# Patient Record
Sex: Male | Born: 1970 | Race: White | Hispanic: No | Marital: Single | Smoking: Current some day smoker
Health system: Southern US, Community
[De-identification: ages and names within clinical notes are randomized; demographics above are authoritative.]

## PROBLEM LIST (undated history)

## (undated) ENCOUNTER — Emergency Department (HOSPITAL_BASED_OUTPATIENT_CLINIC_OR_DEPARTMENT_OTHER): Admission: EM | Payer: 59 | Source: Home / Self Care

## (undated) DIAGNOSIS — I2510 Atherosclerotic heart disease of native coronary artery without angina pectoris: Secondary | ICD-10-CM

## (undated) DIAGNOSIS — I82409 Acute embolism and thrombosis of unspecified deep veins of unspecified lower extremity: Secondary | ICD-10-CM

## (undated) DIAGNOSIS — J449 Chronic obstructive pulmonary disease, unspecified: Secondary | ICD-10-CM

## (undated) DIAGNOSIS — J45909 Unspecified asthma, uncomplicated: Secondary | ICD-10-CM

## (undated) DIAGNOSIS — I214 Non-ST elevation (NSTEMI) myocardial infarction: Secondary | ICD-10-CM

## (undated) HISTORY — PX: KNEE SURGERY: SHX244

## (undated) HISTORY — PX: CORONARY ANGIOPLASTY: SHX604

## (undated) HISTORY — PX: TENDON REPAIR: SHX5111

## (undated) HISTORY — DX: Non-ST elevation (NSTEMI) myocardial infarction: I21.4

## (undated) HISTORY — DX: Atherosclerotic heart disease of native coronary artery without angina pectoris: I25.10

## (undated) HISTORY — DX: Chronic obstructive pulmonary disease, unspecified: J44.9

## (undated) HISTORY — PX: CARDIAC CATHETERIZATION: SHX172

## (undated) HISTORY — DX: Unspecified asthma, uncomplicated: J45.909

## (undated) HISTORY — DX: Acute embolism and thrombosis of unspecified deep veins of unspecified lower extremity: I82.409

---

## 1999-12-05 ENCOUNTER — Emergency Department (HOSPITAL_COMMUNITY): Admission: EM | Admit: 1999-12-05 | Discharge: 1999-12-05 | Payer: Self-pay | Admitting: Emergency Medicine

## 2001-01-10 ENCOUNTER — Emergency Department (HOSPITAL_COMMUNITY): Admission: EM | Admit: 2001-01-10 | Discharge: 2001-01-10 | Payer: Self-pay | Admitting: Emergency Medicine

## 2001-10-18 ENCOUNTER — Emergency Department (HOSPITAL_COMMUNITY): Admission: EM | Admit: 2001-10-18 | Discharge: 2001-10-18 | Payer: Self-pay | Admitting: Emergency Medicine

## 2001-12-20 ENCOUNTER — Emergency Department (HOSPITAL_COMMUNITY): Admission: EM | Admit: 2001-12-20 | Discharge: 2001-12-20 | Payer: Self-pay | Admitting: Emergency Medicine

## 2002-04-21 ENCOUNTER — Encounter: Payer: Self-pay | Admitting: Orthopaedic Surgery

## 2002-04-21 ENCOUNTER — Encounter: Admission: RE | Admit: 2002-04-21 | Discharge: 2002-04-21 | Payer: Self-pay | Admitting: Orthopaedic Surgery

## 2002-04-23 ENCOUNTER — Emergency Department (HOSPITAL_COMMUNITY): Admission: EM | Admit: 2002-04-23 | Discharge: 2002-04-23 | Payer: Self-pay | Admitting: Emergency Medicine

## 2002-05-04 ENCOUNTER — Emergency Department (HOSPITAL_COMMUNITY): Admission: EM | Admit: 2002-05-04 | Discharge: 2002-05-04 | Payer: Self-pay | Admitting: *Deleted

## 2002-05-30 ENCOUNTER — Inpatient Hospital Stay (HOSPITAL_COMMUNITY): Admission: EM | Admit: 2002-05-30 | Discharge: 2002-06-01 | Payer: Self-pay | Admitting: *Deleted

## 2002-08-05 DIAGNOSIS — I82409 Acute embolism and thrombosis of unspecified deep veins of unspecified lower extremity: Secondary | ICD-10-CM

## 2002-08-05 HISTORY — DX: Acute embolism and thrombosis of unspecified deep veins of unspecified lower extremity: I82.409

## 2002-11-24 ENCOUNTER — Emergency Department (HOSPITAL_COMMUNITY): Admission: EM | Admit: 2002-11-24 | Discharge: 2002-11-24 | Payer: Self-pay | Admitting: Emergency Medicine

## 2003-01-26 ENCOUNTER — Encounter: Payer: Self-pay | Admitting: Emergency Medicine

## 2003-01-26 ENCOUNTER — Emergency Department (HOSPITAL_COMMUNITY): Admission: EM | Admit: 2003-01-26 | Discharge: 2003-01-26 | Payer: Self-pay | Admitting: Emergency Medicine

## 2003-01-27 ENCOUNTER — Encounter: Admission: RE | Admit: 2003-01-27 | Discharge: 2003-01-27 | Payer: Self-pay | Admitting: Internal Medicine

## 2003-01-31 ENCOUNTER — Encounter: Admission: RE | Admit: 2003-01-31 | Discharge: 2003-01-31 | Payer: Self-pay | Admitting: Internal Medicine

## 2003-02-03 ENCOUNTER — Encounter: Admission: RE | Admit: 2003-02-03 | Discharge: 2003-02-03 | Payer: Self-pay | Admitting: Internal Medicine

## 2003-03-17 ENCOUNTER — Encounter: Payer: Self-pay | Admitting: Emergency Medicine

## 2003-03-17 ENCOUNTER — Emergency Department (HOSPITAL_COMMUNITY): Admission: EM | Admit: 2003-03-17 | Discharge: 2003-03-17 | Payer: Self-pay | Admitting: Emergency Medicine

## 2004-08-29 ENCOUNTER — Ambulatory Visit: Payer: Self-pay | Admitting: Internal Medicine

## 2004-09-26 ENCOUNTER — Ambulatory Visit: Payer: Self-pay | Admitting: Internal Medicine

## 2004-10-11 ENCOUNTER — Ambulatory Visit: Payer: Self-pay | Admitting: Internal Medicine

## 2005-01-21 ENCOUNTER — Ambulatory Visit: Payer: Self-pay | Admitting: Internal Medicine

## 2005-01-22 ENCOUNTER — Ambulatory Visit: Payer: Self-pay | Admitting: Internal Medicine

## 2005-05-28 ENCOUNTER — Ambulatory Visit: Payer: Self-pay | Admitting: Internal Medicine

## 2005-06-25 ENCOUNTER — Ambulatory Visit: Payer: Self-pay | Admitting: Internal Medicine

## 2005-07-24 ENCOUNTER — Ambulatory Visit: Payer: Self-pay | Admitting: Internal Medicine

## 2005-07-25 ENCOUNTER — Ambulatory Visit: Payer: Self-pay | Admitting: Internal Medicine

## 2005-11-19 ENCOUNTER — Ambulatory Visit: Payer: Self-pay | Admitting: Internal Medicine

## 2005-11-20 ENCOUNTER — Ambulatory Visit: Payer: Self-pay | Admitting: Internal Medicine

## 2005-12-25 ENCOUNTER — Ambulatory Visit: Payer: Self-pay | Admitting: Internal Medicine

## 2006-02-24 ENCOUNTER — Ambulatory Visit: Payer: Self-pay | Admitting: Internal Medicine

## 2006-03-17 ENCOUNTER — Encounter: Admission: RE | Admit: 2006-03-17 | Discharge: 2006-03-17 | Payer: Self-pay | Admitting: Internal Medicine

## 2006-05-26 ENCOUNTER — Ambulatory Visit: Payer: Self-pay | Admitting: Internal Medicine

## 2006-09-23 ENCOUNTER — Ambulatory Visit: Payer: Self-pay | Admitting: Internal Medicine

## 2006-12-23 ENCOUNTER — Ambulatory Visit: Payer: Self-pay | Admitting: Internal Medicine

## 2007-04-22 ENCOUNTER — Ambulatory Visit: Payer: Self-pay | Admitting: Internal Medicine

## 2007-04-28 ENCOUNTER — Ambulatory Visit: Payer: Self-pay | Admitting: Internal Medicine

## 2007-06-04 ENCOUNTER — Ambulatory Visit: Payer: Self-pay | Admitting: Internal Medicine

## 2007-08-03 ENCOUNTER — Ambulatory Visit: Payer: Self-pay | Admitting: Internal Medicine

## 2007-08-10 ENCOUNTER — Telehealth: Payer: Self-pay | Admitting: Internal Medicine

## 2007-08-10 ENCOUNTER — Ambulatory Visit: Payer: Self-pay | Admitting: Internal Medicine

## 2007-08-10 DIAGNOSIS — J449 Chronic obstructive pulmonary disease, unspecified: Secondary | ICD-10-CM

## 2007-08-10 DIAGNOSIS — J302 Other seasonal allergic rhinitis: Secondary | ICD-10-CM

## 2007-08-10 DIAGNOSIS — J3089 Other allergic rhinitis: Secondary | ICD-10-CM

## 2007-08-10 DIAGNOSIS — I82409 Acute embolism and thrombosis of unspecified deep veins of unspecified lower extremity: Secondary | ICD-10-CM

## 2007-08-10 DIAGNOSIS — F17200 Nicotine dependence, unspecified, uncomplicated: Secondary | ICD-10-CM

## 2007-08-10 DIAGNOSIS — J4489 Other specified chronic obstructive pulmonary disease: Secondary | ICD-10-CM | POA: Insufficient documentation

## 2007-08-10 DIAGNOSIS — J45909 Unspecified asthma, uncomplicated: Secondary | ICD-10-CM | POA: Insufficient documentation

## 2007-08-20 ENCOUNTER — Ambulatory Visit: Payer: Self-pay | Admitting: Internal Medicine

## 2007-10-19 ENCOUNTER — Ambulatory Visit: Payer: Self-pay | Admitting: Internal Medicine

## 2007-11-16 ENCOUNTER — Telehealth (INDEPENDENT_AMBULATORY_CARE_PROVIDER_SITE_OTHER): Payer: Self-pay | Admitting: *Deleted

## 2008-01-27 ENCOUNTER — Telehealth: Payer: Self-pay | Admitting: Internal Medicine

## 2008-01-27 ENCOUNTER — Ambulatory Visit: Payer: Self-pay | Admitting: Internal Medicine

## 2008-02-18 ENCOUNTER — Ambulatory Visit: Payer: Self-pay | Admitting: Internal Medicine

## 2008-03-11 ENCOUNTER — Telehealth (INDEPENDENT_AMBULATORY_CARE_PROVIDER_SITE_OTHER): Payer: Self-pay | Admitting: *Deleted

## 2008-07-28 ENCOUNTER — Ambulatory Visit: Payer: Self-pay | Admitting: Internal Medicine

## 2008-08-19 ENCOUNTER — Ambulatory Visit: Payer: Self-pay | Admitting: Internal Medicine

## 2009-01-12 ENCOUNTER — Telehealth: Payer: Self-pay | Admitting: Internal Medicine

## 2009-01-13 ENCOUNTER — Ambulatory Visit: Payer: Self-pay | Admitting: Internal Medicine

## 2009-01-13 ENCOUNTER — Telehealth (INDEPENDENT_AMBULATORY_CARE_PROVIDER_SITE_OTHER): Payer: Self-pay | Admitting: *Deleted

## 2009-01-17 ENCOUNTER — Ambulatory Visit: Payer: Self-pay | Admitting: Internal Medicine

## 2009-06-07 ENCOUNTER — Telehealth: Payer: Self-pay | Admitting: Internal Medicine

## 2009-08-09 ENCOUNTER — Telehealth: Payer: Self-pay | Admitting: Internal Medicine

## 2010-01-09 ENCOUNTER — Encounter: Payer: Self-pay | Admitting: Internal Medicine

## 2010-01-09 ENCOUNTER — Telehealth (INDEPENDENT_AMBULATORY_CARE_PROVIDER_SITE_OTHER): Payer: Self-pay | Admitting: *Deleted

## 2010-01-10 ENCOUNTER — Encounter: Payer: Self-pay | Admitting: Internal Medicine

## 2010-02-02 ENCOUNTER — Ambulatory Visit: Payer: Self-pay | Admitting: Internal Medicine

## 2010-02-12 ENCOUNTER — Telehealth (INDEPENDENT_AMBULATORY_CARE_PROVIDER_SITE_OTHER): Payer: Self-pay | Admitting: *Deleted

## 2010-02-26 ENCOUNTER — Telehealth: Payer: Self-pay | Admitting: Internal Medicine

## 2010-04-02 ENCOUNTER — Ambulatory Visit: Payer: Self-pay | Admitting: Internal Medicine

## 2010-04-13 ENCOUNTER — Telehealth (INDEPENDENT_AMBULATORY_CARE_PROVIDER_SITE_OTHER): Payer: Self-pay | Admitting: *Deleted

## 2010-04-30 ENCOUNTER — Telehealth (INDEPENDENT_AMBULATORY_CARE_PROVIDER_SITE_OTHER): Payer: Self-pay | Admitting: *Deleted

## 2010-05-03 ENCOUNTER — Telehealth (INDEPENDENT_AMBULATORY_CARE_PROVIDER_SITE_OTHER): Payer: Self-pay | Admitting: *Deleted

## 2010-06-27 ENCOUNTER — Telehealth (INDEPENDENT_AMBULATORY_CARE_PROVIDER_SITE_OTHER): Payer: Self-pay | Admitting: *Deleted

## 2010-07-02 ENCOUNTER — Telehealth (INDEPENDENT_AMBULATORY_CARE_PROVIDER_SITE_OTHER): Payer: Self-pay | Admitting: *Deleted

## 2010-07-31 ENCOUNTER — Ambulatory Visit: Payer: Self-pay | Admitting: Internal Medicine

## 2010-08-07 ENCOUNTER — Telehealth: Payer: Self-pay | Admitting: Internal Medicine

## 2010-09-04 ENCOUNTER — Ambulatory Visit: Admit: 2010-09-04 | Payer: Self-pay | Admitting: Internal Medicine

## 2010-09-04 NOTE — Progress Notes (Signed)
Summary: chest tightness  Phone Note Call from Patient Call back at Home Phone [phone]   Caller: Patient Call For: young Reason for Call: Talk to Nurse Summary of Call: experiencing earlysummer tightness in chest, using inhaler more.  Can you call in pred[facility]one? [facility] Initial call taken by: Eugene Gavia,  January 09, 2010 8:58 AM  Follow-up for Phone Call        [facility].Carron Curie CMA  January 09, 2010 10:44 AM [phone] returning call to nurse .Marland KitchenChantel Bowne  January 09, 2010 12:23 PM  pt c/o increased chest tightness x 2 days. Denies increased SOB or congestion just tightness. he states he is having to use his proair more often then usual. he staets CY usually gives him prednisone and it helps. Please advise.Carron Curie CMA  January 09, 2010 1:29 PM allergies: NKDA  Additional Follow-up for Phone Call Additional follow up Details #1::        Per CDY-ok to give pred taper as follows. no refills.Reynaldo Minium CMA  January 09, 2010 1:41 PM    Pt is aware of rx sent to pharmacy.Reynaldo Minium CMA  January 09, 2010 1:42 PM     Prescriptions: PREDNISONE 10 MG TABS (PREDNISONE) 1 tab four times daily x 2 days, 3 times daily x 2 days, 2 times daily x 2 days, 1 time daily x 2 days  #20 x 0   Entered by:   Reynaldo Minium CMA   Authorized by:   Waymon Budge MD   Signed by:   Reynaldo Minium CMA on 01/09/2010   Method used:   Electronically to        [facility] [address] [phone]* (retail)       [address]       [place], [place]  [address]       Ph: [phone] or [phone]       Fax: [phone]   RxID:   [id number]

## 2010-09-04 NOTE — Progress Notes (Signed)
Summary: Prednisone RX needed  Phone Note Call from Patient   Caller: Patient Call For: Young Summary of Call: Pt wants to know if Dr. Maple Hudson would call in a RX for Prednisone to take 1 once daily as pt is using ProAir HFA inhaler more often. States he has tried and failed Singulair and other maitence inhalers. Initial call taken by: Reynaldo Minium CMA,  June 27, 2010 2:26 PM  Follow-up for Phone Call        I spoke with CDY about this and he has approved for patient to have Prednisone 5mg  #100 take 1-2 once daily with no refills. Pt is aware and would like to have rx [facility]. RX sent and patient is aware.Reynaldo Minium CMA  June 27, 2010 2:27 PM     New/Updated Medications: PREDNISONE 5 MG TABS (PREDNISONE) take 1-2 by mouth once daily Prescriptions: PREDNISONE 5 MG TABS (PREDNISONE) take 1-2 by mouth once daily  #100 x 0   Entered by:   Reynaldo Minium CMA   Authorized by:   Waymon Budge MD   Signed by:   Reynaldo Minium CMA on 06/27/2010   Method used:   Electronically to        [facility] [address] [phone]* (retail)       [address]       [place], [place]  [address]       Ph: [phone] or [phone]       Fax: [phone]   RxID:   [id number]

## 2010-09-04 NOTE — Progress Notes (Signed)
Summary: sample of proair given  Phone Note Call from Patient Call back at Home Phone [phone]   Caller: Patient Call For: young Reason for Call: Talk to Nurse Summary of Call: proair or preventil albuterol inhaler.  Can he get a sample? Initial call taken by: Eugene Gavia,  April 13, 2010 9:02 AM  Follow-up for Phone Call        Spoke with pt and advised 1 sample of proair left up front for pick up. Follow-up by: Vernie Murders,  April 13, 2010 9:07 AM

## 2010-09-04 NOTE — Progress Notes (Signed)
Summary: Prednisone refill request  Phone Note Call from Patient   Caller: Patient Call For: Andee Chivers Reason for Call: Acute Illness Summary of Call: Pt came in for Rx to [facility]; Predisone works best-cough, congestion, used nebulizer 5 times this weekend. Please advise. Initial call taken by: Reynaldo Minium CMA,  February 26, 2010 8:57 AM  Follow-up for Phone Call        Per CDY- give Predisone 10mg  #20 take 4 x 2days, 3 x 2 days, 2 x 2 days, 1 x 2 days then stop no refills.Reynaldo Minium CMA  February 26, 2010 8:59 AM     Prescriptions: PREDNISONE 10 MG TABS (PREDNISONE) 4 tablets x 2days, 3 x 2 days, 2 x 2 days, 1 x 2 days, then stop  #20 x 0   Entered by:   Reynaldo Minium CMA   Authorized by:   Waymon Budge MD   Signed by:   Reynaldo Minium CMA on 02/26/2010   Method used:   Telephoned to ...       [facility] [address] [phone]* (retail)       [address]       [place], [place]  [address]       Ph: [phone] or [phone]       Fax: [phone]   RxID:   [id number]

## 2010-09-04 NOTE — Progress Notes (Signed)
Summary: prescript  Phone Note Call from Patient Call back at [phone]   Caller: Patient Call For: young Summary of Call: need prednisone for breathing problem call pt when done Initial call taken by: Rickard Patience,  August 09, 2009 10:54 AM  Follow-up for Phone Call        pt c/o increased chest tightness as well as productive cough with clear phlegm x 4 days. Pt states he has been using his albuterol neb more then usual as well as his nebs at home. Pt requesting rx for prednisone. Please advise. Allergies: NKDA. Carron Curie CMA  August 09, 2009 11:21 AM   Additional Follow-up for Phone Call Additional follow up Details #1::        Per CY,  prednisone 10mg  #20  4 x2 days, 3 x2days, 2 x2days, 1 x 2days.   will forward back to triage. Gweneth Dimitri RN  August 09, 2009 12:39 PM  rx sent. pt aware.Carron Curie CMA  August 09, 2009 2:47 PM     New/Updated Medications: PREDNISONE 10 MG TABS (PREDNISONE) 4 tablets x 2days, 3 x 2 days, 2 x 2 days, 1 x 2 days, then stop Prescriptions: PREDNISONE 10 MG TABS (PREDNISONE) 4 tablets x 2days, 3 x 2 days, 2 x 2 days, 1 x 2 days, then stop  #20 x 0   Entered by:   Carron Curie CMA   Authorized by:   Waymon Budge MD   Signed by:   Carron Curie CMA on 08/09/2009   Method used:   Electronically to        [facility] [address] [phone]* (retail)       [address]       [place], [place]  [address]       Ph: [phone] or [phone]       Fax: [phone]   RxID:   [id number]

## 2010-09-04 NOTE — Medication Information (Signed)
Summary: Enrollment Form/[employer]  Enrollment Form/[employer]   Imported By: Sherian Rein 01/15/2010 10:49:09  _____________________________________________________________________  External Attachment:    Type:   Image     Comment:   External Document

## 2010-09-04 NOTE — Assessment & Plan Note (Signed)
Summary: yearly follow up visit/kcw   Primary Provider/Referring Provider:  Elmore Guise  CC:  yearly follow up visit-allergies..  History of Present Illness: 02/17/09- 40 year old man returning for follow-up.  History of deep vein thrombosis, and allergic rhinitis with asthma.  He is developing COPD, but continues to smoke despite extensive discussion.  We called in a prednisone taper, April 13.  He feels he is doing well today.  There is always some wheeze.  Pulmonary function testing in January showed mild to moderate obstruction.  He continues allergy vaccine at 1:10 without problems through he denies fever, purulent discharge, adenopathy, blood, chest pain or palpitation, leg pain.  08/19/08- Sore throat, cough, now dry hack, nasal stuffy. No fever but on tylenol. Still smokes 1 ppW. Not needed nebulizer lately. Doing his allergy shots.Scant green. Discussed options, and leaned on him again about smoking cessation.  01/17/09- Asthma/ copd, hx DVT, allergic rhinitis, tobacco Finishing Biaxin and helps, but needing his inhaler much more in last 2 days and feels tight. Feels that a neb, depo and pred taper would fix him. Denies residual fever or sore throat but this same pattern has  been going around at work.   April 02, 2010- Asthma/ COPD. hx DVT, allergic rhinitis, tobacco TB test PPD was Neg- done for work. Has no antiinflammatory meds now. Uses his albuterol by neb only every few months.  uses Proair 2x/ day on average. Feels pretty good now. Last prednisone was 1 month ago, after spray painting outdoors in the heat.     Asthma History    Initial Asthma Severity Rating:    Age range: 12+ years    Symptoms: daily    Nighttime Awakenings: 0-2/month    Interferes w/ normal activity: no limitations    SABA use (not for EIB): daily    Asthma Severity Assessment: Moderate Persistent   Preventive Screening-Counseling & Management  Alcohol-Tobacco     Smoking Status: current     Smoke  Cessation Stage: contemplative     Packs/Day: 1/2 or less     Year Started: 1990     Tobacco Counseling: to quit use of tobacco products  Current Medications (verified): 1)  Proair Hfa 108 (90 Base) Mcg/act  Aers (Albuterol Sulfate) .... Inhale 2 Puffs Every 4 To 6 Hours As Needed For Shortness of Breath 2)  Albuterol Sulfate (2.5 Mg/75ml) 0.083%  Nebu (Albuterol Sulfate) .... Use As Directed 3)  Allergy Vaccine 1:10 Go (W-E) .... Had Been On 1:50 Per Lab. Now Wil Lmove Up To 1:10. 4)  Epipen 2-Pak 0.3 Mg/0.64ml (1:1000)  Devi (Epinephrine Hcl (Anaphylaxis)) .... For Severe Allergic Reaction  Allergies (verified): No Known Drug Allergies  Past History:  Past Medical History: Last updated: 08/10/2007 DVT  left leg with Pulmonary embolism 2004 after knee surgery Knee surgery- ACL Chronic asthma since childhood Allergic rhinitis Tendon repair R wrist  Past Surgical History: Last updated: 08/19/2008 Right wrist tendon repair 6 ACL /cartilage surgeries left knee  Family History: Last updated: 08/10/2007 Mother- asthma  Social History: Last updated: 08/10/2007 Patient is a current smoker. Heat and airconditioning repair   Risk Factors: Smoking Status: current (04/02/2010) Packs/Day: 1/2 or less (04/02/2010)  Review of Systems      See HPI       The patient complains of non-productive cough.  The patient denies shortness of breath with activity, shortness of breath at rest, productive cough, coughing up blood, chest pain, irregular heartbeats, acid heartburn, indigestion, loss of appetite, weight change,  abdominal pain, difficulty swallowing, sore throat, tooth/dental problems, headaches, nasal congestion/difficulty breathing through nose, and sneezing.    Vital Signs:  Patient profile:   40 year old male Height:      75 inches Weight:      237.25 pounds BMI:     29.76 O2 Sat:      97 % on Room air Pulse rate:   85 / minute BP sitting:   118 / 78  (right arm) Cuff size:    regular  Vitals Entered By: Reynaldo Minium CMA (April 02, 2010 9:19 AM)  O2 Flow:  Room air CC: yearly follow up visit-allergies.   Physical Exam  Additional Exam:  General: A/Ox3; pleasant and cooperative, NAD, muscular young man, putting on more weight SKIN: no rash, lesions NODES: no lymphadenopathy HEENT: [place]/AT, EOM- WNL, Conjuctivae- clear, PERRLA, TM-WNL, Nose- clear, Throat- clear and wnl, Mallampati II NECK: Supple w/ fair ROM, JVD- none, normal carotid impulses w/o bruits Thyroid- CHEST: Unlabored bilateral wheeze HEART: RRR, no m/g/r heard ABDOMEN: Soft ZOX:WRUE, nl pulses, no edema  NEURO: Grossly intact to observation      Impression & Recommendations:  Problem # 1:  ASTHMA (ICD-493.90) I am concerned that we can't keep him on a maintenance controller. He  still smokes, works dusty trades, and still tries to deal with this episodically. He says he didn't like Advair aor Symbidcort because they would make him antsy after about a week. He is willing to try Qvar. Does not have good symptom awareness and may need a peak flow meter.  Problem # 2:  TOBACCO ABUSE (ICD-305.1) We talked again about medical concerns with tobacco use, especially aggravation of asthma, available quitting support.  Medications Added to Medication List This Visit: 1)  Qvar 80 Mcg/act Aers (Beclomethasone dipropionate) .... 2 puffs and rinse mouth, twice every day  Other Orders: Est. Patient Level III ([address])  Patient Instructions: 1)  Please schedule a follow-up appointment in 4 months. 2)  Don't for get to get your flu shot Late September-November 3)  Try sample/ script Qvar as a maintenance steroid inhaler 4)  2 puffs and rinxe mouth, twice every day. 5)  Please keep trying to stop smoking Prescriptions: QVAR 80 MCG/ACT AERS (BECLOMETHASONE DIPROPIONATE) 2 puffs and rinse mouth, twice every day  #1 x prn   Entered and Authorized by:   Waymon Budge MD   Signed by:   Waymon Budge MD on 04/02/2010   Method used:   Print then Give to Patient   RxID:   [id number]

## 2010-09-04 NOTE — Progress Notes (Signed)
Summary: rx  Phone Note Call from Patient Call back at Home Phone [phone]   Caller: Patient Call For: young Reason for Call: Talk to Nurse Summary of Call: pt needs a Pro Air & albuterol inhaler.  Going out of town and doesn't want to run out.  FGoing to come by and get a little later.  Initial call taken by: Eugene Gavia,  February 12, 2010 11:34 AM  Follow-up for Phone Call        [facility].  Per pt's records,  it looks like he gets his proair through the [employer] pt assistance program.  Unsure why he would need samples as well.  Arman Filter LPN  February 12, 2010 11:48 AM     Call For: young Summary of Call:Patient walked in office. Leigh checked for samples, out of both.  Patient [facility]  Follow-up by: Lehman Prom,  February 12, 2010 1:03 PM    Prescriptions: PROAIR HFA 108 (90 BASE) MCG/ACT  AERS (ALBUTEROL SULFATE) inhale 2 puffs every 4 to 6 hours as needed for shortness of breath  #1 x 11   Entered by:   Philipp Deputy CMA   Authorized by:   Waymon Budge MD   Signed by:   Philipp Deputy CMA on 02/12/2010   Method used:   Electronically to        [facility] [address] [phone]* (retail)       [address]       [place], [place]  [address]       Ph: [phone] or [phone]       Fax: [phone]   RxID:   [id number]

## 2010-09-04 NOTE — Progress Notes (Signed)
Summary: chest tightness and sinus infection  Phone Note Call from Patient   Caller: Patient Call For: young Summary of Call: pt would like prednisone called to pharmacy for tightness in his chest. [facility] Initial call taken by: Rickard Patience,  May 03, 2010 8:53 AM  Follow-up for Phone Call        called and spoke with pt.  pt believe he is having an asthma/allergy flare up.  Pt c/o tightness in chest, coughing up clear sputum esp at night and in the morning, and increased use of proair and nebs.  Pt states he is having to use proair every 4 to 6 hours "sometimes more" and has use nebs 3 x in the past three days.  Would like rx called in for this.  Also believes he has a sinus infection- head congestion, clear to green nasal drainage, and sinus pressure.  pt denied sore throat or fever.  pt requests abx for this.   please advise.  thanks.  Aundra Millet Reynolds LPN  May 03, 2010 9:59 AM   NKDA  Additional Follow-up for Phone Call Additional follow up Details #1::        Per CDY-okay to give Doxycyline 100mg  # take 2 today then 1 daily until gone no refills and Prednisone 10mg  #20 take 4 by mouth x 2 days, 3 by mouth x 2 days, 2 by mouth x 2 days, 1 by mouth x 2 days, then stop no refills.Reynaldo Minium CMA  May 03, 2010 11:29 AM    Pt aware that RX's sent to drug store.Reynaldo Minium CMA  May 03, 2010 12:04 PM    New Allergies: ! * ZPAK New/Updated Medications: PREDNISONE 10 MG TABS (PREDNISONE) take 4 x 2 days, 3 x 2 days, 2 x 2 days, 1 x 2 days, then stop DOXYCYCLINE HYCLATE 100 MG TABS (DOXYCYCLINE HYCLATE) take 2 by mouth today then 1 daily until gone New Allergies: ! * ZPAKPrescriptions: DOXYCYCLINE HYCLATE 100 MG TABS (DOXYCYCLINE HYCLATE) take 2 by mouth today then 1 daily until gone  #8 x 0   Entered by:   Reynaldo Minium CMA   Authorized by:   Waymon Budge MD   Signed by:   Reynaldo Minium CMA on 05/03/2010   Method used:   Electronically to          [facility] [address] [phone]* (retail)       [address]       [place], [place]  [address]       Ph: [phone] or [phone]       Fax: [phone]   RxID:   [id number] PREDNISONE 10 MG TABS (PREDNISONE) take 4 x 2 days, 3 x 2 days, 2 x 2 days, 1 x 2 days, then stop  #20 x 0   Entered by:   Reynaldo Minium CMA   Authorized by:   Waymon Budge MD   Signed by:   Reynaldo Minium CMA on 05/03/2010   Method used:   Electronically to        [facility] [address] [phone]* (retail)       [address]       [place], [place]  [address]       Ph: [phone] or [phone]       Fax: [phone]   RxID:   [id number]

## 2010-09-04 NOTE — Miscellaneous (Signed)
Summary: TB/[facility]  TB/ HealthCare   Imported By: Sherian Rein 02/12/2010 10:01:11  _____________________________________________________________________  External Attachment:    Type:   Image     Comment:   External Document

## 2010-09-04 NOTE — Assessment & Plan Note (Signed)
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 No seen.  Allergies: No Known Drug Allergies   Other Orders: TB Skin Test [phone]) Admin 1st Vaccine ([address]) No Charge Patient Arrived (NCPA0) (NCPA0)   Immunizations Administered:  PPD Skin Test:    Vaccine Type: PPD    Site: left forearm    Mfr: Sanofi Pasteur    Dose: 0.1 ml    Route: ID    Given by: Reynaldo Minium CMA    Exp. Date: 11/29/2011    Lot #: [id number]  PPD Results    Date of reading: 02/04/2010    Results: < 5mm    Interpretation: negative

## 2010-09-04 NOTE — Medication Information (Signed)
Summary: ProAir Approval Notification form/[employer]  ProAir Approval Notification form/[employer]   Imported By: Sherian Rein 01/15/2010 10:48:21  _____________________________________________________________________  External Attachment:    Type:   Image     Comment:   External Document

## 2010-09-04 NOTE — Progress Notes (Signed)
Summary: speak to nurse  Phone Note Call from Patient Call back at Home Phone [phone]   Caller: Patient Call For: young Summary of Call: Wants to know if Florentina Addison has heard anything from the progam that he is to receive his albuterol inhaler from. Initial call taken by: Darletta Moll,  April 30, 2010 10:51 AM  Follow-up for Phone Call        Katie- I think you or Bjorn Loser must be the contact people on this. Follow-up by: Waymon Budge MD,  April 30, 2010 12:55 PM  Additional Follow-up for Phone Call Additional follow up Details #1::        Will call [employer] and ask about this for the patient.Reynaldo Minium CMA  April 30, 2010 3:30 PM    Spoke with [employer]-placed order for pt's medicine and flagged myself to place new order again in December. Pt is aware that RX should be here by Tuesday next week.Reynaldo Minium CMA  May 01, 2010 9:54 AM

## 2010-09-04 NOTE — Progress Notes (Signed)
Summary: PROAIR HFA PT ASSISTANCE REFILL  ---- Converted from flag ---- ---- 05/01/2010 4:52 PM, Reynaldo Minium CMA wrote: Please call and reorder patients ProAir HFA refill through [employer] assistance program [phone] CardID # [id number]  Takes about a week to get and his next order is due 07-08-10.   Vivianne Spence ------------------------------  I called and placed order for ProAir HFA refill-order to be sent after 07-08-10 to our office. Will call patient once RX is here. FYI: Pt has one more time to refill RX before needing to redo assistance program paperwork.

## 2010-09-06 NOTE — Progress Notes (Signed)
Summary: Acute call-sinus infection  Phone Note Call from Patient Call back at Home Phone [phone]   Caller: Patient Call For: Rustyn Conery Reason for Call: Acute Illness Summary of Call: Pt called stating that he is having a sinus infection, stuffy nose, drainage, and cough. No fever or chills. Requesting Doxycycline or Keflex be called in at [facility] [address] Please Advise. Initial call taken by: Reynaldo Minium CMA,  August 07, 2010 9:07 AM  Follow-up for Phone Call        Per CDY-okay to send Doxycycline 100mg  #8 take 2 today then 1 daily no refills. Also pt must keep appt in January per CDY or no more phone rx's.Reynaldo Minium CMA  August 07, 2010 9:37 AM    I called and left message for patient on phone number given to keep appt and call me back today to discuss this matter with him.Reynaldo Minium CMA  August 07, 2010 9:38 AM     Prescriptions: DOXYCYCLINE HYCLATE 100 MG TABS (DOXYCYCLINE HYCLATE) take 2 by mouth today then 1 daily until gone  #8 x 0   Entered by:   Reynaldo Minium CMA   Authorized by:   Waymon Budge MD   Signed by:   Reynaldo Minium CMA on 08/07/2010   Method used:   Electronically to        [facility]  [facility] [address] [phone]* (retail)       [address]       [place], [place]  [address]       Ph: [phone] or [phone]       Fax: [phone]   RxID:   [id number]

## 2010-09-14 ENCOUNTER — Other Ambulatory Visit: Payer: Self-pay | Admitting: Internal Medicine

## 2010-09-14 ENCOUNTER — Encounter: Payer: Self-pay | Admitting: Internal Medicine

## 2010-09-14 ENCOUNTER — Ambulatory Visit (INDEPENDENT_AMBULATORY_CARE_PROVIDER_SITE_OTHER)
Admission: RE | Admit: 2010-09-14 | Discharge: 2010-09-14 | Disposition: A | Discharge status: Home / Self Care | Payer: PRIVATE HEALTH INSURANCE | Source: Ambulatory Visit | Attending: Internal Medicine | Admitting: Internal Medicine

## 2010-09-14 ENCOUNTER — Ambulatory Visit (INDEPENDENT_AMBULATORY_CARE_PROVIDER_SITE_OTHER): Payer: PRIVATE HEALTH INSURANCE | Admitting: Internal Medicine

## 2010-09-14 DIAGNOSIS — J449 Chronic obstructive pulmonary disease, unspecified: Secondary | ICD-10-CM

## 2010-09-14 DIAGNOSIS — J309 Allergic rhinitis, unspecified: Secondary | ICD-10-CM

## 2010-09-14 DIAGNOSIS — J45909 Unspecified asthma, uncomplicated: Secondary | ICD-10-CM

## 2010-09-14 DIAGNOSIS — F17200 Nicotine dependence, unspecified, uncomplicated: Secondary | ICD-10-CM

## 2010-09-20 NOTE — Assessment & Plan Note (Signed)
Summary: follow up visit/kcw   Primary Provider/Referring Provider:  Elmore Guise  CC:  Follow up visit-allergies and asthma.Using Prednisone occasionally.Tyler [name]  History of Present Illness: 01/17/09- Asthma/ copd, hx DVT, allergic rhinitis, tobacco Finishing Biaxin and helps, but needing his inhaler much more in last 2 days and feels tight. Feels that a neb, depo and pred taper would fix him. Denies residual fever or sore throat but this same pattern has  been going around at work.   April 02, 2010- Asthma/ COPD. hx DVT, allergic rhinitis, tobacco TB test PPD was Neg- done for work. Has no antiinflammatory meds now. Uses his albuterol by neb only every few months.  uses Proair 2x/ day on average. Feels pretty good now. Last prednisone was 1 month ago, after spray painting outdoors in the heat.   September 14, 2010- Asthma/ COPD. hx DVT, allergic rhinitis, tobacco Nurse-CC: Follow up visit-allergies and asthma.Using Prednisone occasionally. Has had long work hours, needing to call occasionally for antibiotics and care for sinusitis and bronchitis. he uses a little prednisone occasionally but tries to be sparing. None in 2 weeks and feels well currently. Expects some cough and wheeze, expecially in the evenings. Uses Proair about twice daily. Has slowed way down on smoking, going to gym. Neb only twice in 6-7 months. Drifted off Qvar when w/o insurance.  Asthma History    Asthma Control Assessment:    Age range: 12+ years    Symptoms: >2 days/week    Nighttime Awakenings: 0-2/month    Interferes w/ normal activity: no limitations    SABA use (not for EIB): several times per day    Asthma Control Assessment: Very Poorly Controlled   Preventive Screening-Counseling & Management  Alcohol-Tobacco     Smoking Status: current     Smoke Cessation Stage: contemplative     Packs/Day: 1/2 or less     Year Started: 1990     Tobacco Counseling: to quit use of tobacco products  Current Medications  (verified): 1)  Proair Hfa 108 (90 Base) Mcg/act  Aers (Albuterol Sulfate) .... Inhale 2 Puffs Every 4 To 6 Hours As Needed For Shortness of Breath 2)  Albuterol Sulfate (2.5 Mg/5ml) 0.083%  Nebu (Albuterol Sulfate) .... Use As Directed 3)  Allergy Vaccine 1:10 Go (W-E) .... Had Been On 1:50 Per Lab. Now Wil Lmove Up To 1:10. 4)  Epipen 2-Pak 0.3 Mg/0.31ml (1:1000)  Devi (Epinephrine Hcl (Anaphylaxis)) .... For Severe Allergic Reaction 5)  Qvar 80 Mcg/act Aers (Beclomethasone Dipropionate) .... 2 Puffs and Rinse Mouth, Twice Every Day 6)  Prednisone 5 Mg Tabs (Prednisone) .... Take 1-2 By Mouth Once Daily  Allergies (verified): 1)  ! * Zpak  Past History:  Past Medical History: Last updated: 08/10/2007 DVT  left leg with Pulmonary embolism 2004 after knee surgery Knee surgery- ACL Chronic asthma since childhood Allergic rhinitis Tendon repair R wrist  Past Surgical History: Last updated: 08/19/2008 Right wrist tendon repair 6 ACL /cartilage surgeries left knee  Family History: Last updated: 09/14/2010 Mother- living, DM Father- died unknown cause  Social History: Last updated: 09/14/2010 Patient is a current smoker. Heat and airconditioning repair  Divorced, 1 daughter  Risk Factors: Smoking Status: current (09/14/2010) Packs/Day: 1/2 or less (09/14/2010)  Family History: Mother- living, DM Father- died unknown cause  Social History: Patient is a current smoker. Heat and airconditioning repair  Divorced, 1 daughter  Review of Systems      See HPI       The patient  complains of non-productive cough and nasal congestion/difficulty breathing through nose.  The patient denies shortness of breath with activity, shortness of breath at rest, productive cough, coughing up blood, chest pain, irregular heartbeats, acid heartburn, indigestion, loss of appetite, weight change, abdominal pain, difficulty swallowing, sore throat, tooth/dental problems, headaches, sneezing,  itching, ear ache, anxiety, hand/feet swelling, rash, change in color of mucus, and fever.    Vital Signs:  Patient profile:   40 year old male Height:      75 inches Weight:      225.25 pounds BMI:     28.26 O2 Sat:      97 % on Room air Pulse rate:   68 / minute BP sitting:   148 / 88  (left arm) Cuff size:   large  Vitals Entered By: Reynaldo Minium CMA (September 14, 2010 3:40 PM)  O2 Flow:  Room air CC: Follow up visit-allergies and asthma.Using Prednisone occasionally.   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 Cough was raspy x 1. HEART: RRR, no m/g/r heard ABDOMEN: Soft ZOX:WRUE, nl pulses, no edema  NEURO: Grossly intact to observation      Impression & Recommendations:  Problem # 1:  ASTHMA (ICD-493.90) Chronic obstructive asthma/ chronic bronchitis. We are pushing at his continued smoking, but pleased he has cut down. I had a detailed discussion about steroid use- systemic vs inhaled, and steroid side effects especialy bone.  He agrees to bone denisty check and we will update CXR. He dropped off allergy vaccine when he lost insurance.  Failed Singulair, never had theophylline.  He has chosen not to get flu or pneumovax- discussed.   Problem # 2:  TOBACCO ABUSE (ICD-305.1) Tobaco counseling reinforced.   Other Orders: Est. Patient Level III (45409) T-Bone Densitometry (81191) T-2 View CXR (71020TC)  Patient Instructions: 1)  Please schedule a follow-up appointment in 6 months. 2)  A Dexa Scan has been recommended.  Your imaging study may require preauthorization.  3)  A chest x-ray has been recommended.   Your imaging study may require preauthorization.  4)  Use the least amount of prednisone you can get by with. Talk to [place] if you need guidance or are not doing well. 5)  Please try very hard to get off those cigarettes. 6)  [facility] smoking cessation info sheet.  7)  Refill script for neb medicine Prescriptions: ALBUTEROL SULFATE (2.5 MG/3ML) 0.083%  NEBU (ALBUTEROL SULFATE) use as directed  #25 x prn   Entered and Authorized by:   Waymon Budge MD   Signed by:   Waymon Budge MD on 09/14/2010   Method used:   Print then Give to Patient   RxID:   [id number]

## 2010-11-07 ENCOUNTER — Telehealth: Payer: Self-pay | Admitting: Internal Medicine

## 2010-11-07 MED ORDER — DOXYCYCLINE HYCLATE 100 MG PO TABS: ORAL_TABLET | ORAL | Status: DC

## 2010-11-07 MED ORDER — PREDNISONE 10 MG PO TABS: ORAL_TABLET | ORAL | Status: DC

## 2010-11-07 NOTE — Telephone Encounter (Signed)
Pt is aware that RX's sent.Tyler [name]

## 2010-11-07 NOTE — Telephone Encounter (Signed)
Spoke with pt and he c/o cough w/ clear phlem, chest congestion, watery eyes, post nasal drip. Pt believes he has a sinus infection. Pt req to have prednisone and doxycycline called in to [facility]. Dr. Maple Hudson please advise. Thanks  KNDA  Carver Fila, CMA

## 2010-11-07 NOTE — Telephone Encounter (Signed)
Ok to script doxycycline 100 mg, # 8, 2 today then one daily    Refill x 2                     Prednisone 10 mg, # 20,   4 X 2 DAYS, 3 X 2 DAYS, 2 X 2 DAYS, 1 X 2 DAYS    Ref x 2

## 2010-12-18 NOTE — Assessment & Plan Note (Signed)
[facility]                             PULMONARY OFFICE NOTE   NAME:Tyler [name], Tyler [name]                MRN:          [id number]  DATE:04/22/2007                            DOB:          [birth year]    PROBLEM:  1. Chronic asthma/obstructive pulmonary disease.  2. Pulmonary embolism after knee surgery 2004.  3. Allergic rhinitis.  4. Tobacco abuse.  5. Rhinitis medicamentosa.   HISTORY:  He dropped off his allergy vaccine about 3 months ago saying  he forgot and describes being sent out of town on work a couple of  times.  His girlfriend works at [employer] allergy office and  apparently is on him to get restarted on his shots.  He does heat and  air conditioning work.  He needed a prednisone burst from [place] in late  August but feels well today. He cancelled his last pulmonary function  tests which he was sent out of town.  He could not afford Symbicort,  dropped off of Asmanex and essentially is taking no medications now.  I  reviewed medication options, goals and management.   OBJECTIVE:  VITAL SIGNS:  Weight 230 pounds.  Blood pressure 138/76.  Pulse 76.  Room air saturation 100%.  CHEST:  Wheezy quality to his laughter, otherwise clear with no wheeze  during quiet breathing.  HEART:  Sounds regular and normal.   IMPRESSION:  1. Medication noncompliance.  2. Tobacco use.  3. Rhinitis.  4. Asthma/chronic obstructive pulmonary disease.   PLAN:  1. Smoking cessation.  2. Steroid discussion.  3. Pulmonary function tests.  4. Chest x-ray.  5. We are going to try once more to restart allergy vaccine, only as a      component of therapy.  6. Refill albuterol rescue inhaler with discussion.  7. Scheduled return four months, earlier p.r.n.    Clinton D. Maple Hudson, MD, Tonny Bollman, FACP  Electronically Signed   CDY/MedQ  DD: 04/22/2007  DT: 04/23/2007  Job #: [id number]   cc:   Erskine Speed, M.D.

## 2010-12-18 NOTE — Assessment & Plan Note (Signed)
[facility]                             PULMONARY OFFICE NOTE   NAME:[name], [name]                MRN:          [id number]  DATE:06/04/2007                            DOB:          [birth year]    PROBLEMS:  1. Chronic asthma/chronic obstructive pulmonary disease.  2. Pulmonary embolism after knee surgery in 2004.  3. Allergic rhinitis.  4. Tobacco abuse.  5. Rhinitis medicamentosa   HISTORY:  Head cold has moved into his chest. Head congestion, sore  throat, cough, scant phlegm. He has been doing saline lavage b.i.d. No  routine medication except for an albuterol inhaler in spite of his  previous problems and multiple medications that we have tried to get  started for him. He does insist that he continues his vaccine now at 1  to 68 with no problems. He got flu vaccine at work.   OBJECTIVE:  Weight 227 pounds, blood pressure 112/60, pulse 85, room air  saturation 97%. Odor of tobacco reflecting ongoing smoking despite  counseling. There is some turbinate edema. Pharynx is red without  exudates. Unlabored wheeze. No hoarseness. Heart sounds are regular  without murmur.   IMPRESSION:  Asthma/chronic obstructive pulmonary disease with acute  exacerbation most consistent with a viral illness; ongoing tobacco  abuse.   PLAN:  Smoking cessation, doxycycline for 7 days, nebulizer Xopenex 1.25  mg, prednisone 8 day taper from 40 mg with steroid talk. Keep scheduled  appointment, earlier p.r.n.     Clinton D. Maple Hudson, MD, Tonny Bollman, FACP  Electronically Signed    CDY/MedQ  DD: 06/05/2007  DT: 06/07/2007  Job #: [id number]   cc:   Erskine Speed, M.D.

## 2010-12-21 NOTE — Assessment & Plan Note (Signed)
[facility]                               PULMONARY OFFICE NOTE   NAME:[name], [name]                   MRN:          [id number]  DATE:02/24/2006                            DOB:          [birth year]    PROBLEM:  1.  Chronic asthma/obstructive airways disease.  2.  Pulmonary embolism after knee surgery 2004.  3.  Allergic rhinitis.  4.  Tobacco use.   HISTORY:  Medications helped after last visit and he says he was doing well  until weather changed this week.  He really has not made a serious effort to  stop smoking.  He says prednisone usually helps and we discussed steroids  with significant side effects and need to use other measures if possible.  He did not make much of his trials with Rhinocort Aqua or Asmanex and has  previously not done well with Singulair, Advair, Foradil or Pulmicort.  Reliability of his compliance with these trials is difficult to assess.  He  does continue allergy vaccine at 1:10, giving his own with Epipen available.  He has talked with me carefully about anaphylaxis concerns about  administration outside of medical office, Epinephrine and risk management  alternatives.  He feels strongly that he needs to continue giving his own  therapy logistically possible.   MEDICATIONS:  Allergy vaccine, Restoril, albuterol inhaler, home nebulizer  with albuterol used rarely.   ALLERGIES:  No medication allergies.   OBJECTIVE:  VITAL SIGNS:  Weight 228 pounds.  BP 116/68.  Pulse regular 78.  Room air saturation 97%.  GENERAL:  No distress at all.  CHEST:  He has mild bilateral expiratory wheeze and slightly congested  sounding cough.  HEART:  Regular without murmur.  Work of breathing is not increased.  EXTREMITIES:  No adenopathy.  No edema, cyanosis or clubbing.  HEENT:  His nasal airway is not obviously obstructed.   IMPRESSION:  1.  Asthma with chronic obstructive pulmonary disease.  I am concerned that      we have  not been able to establish a maintenance program with anti-      inflammatory medicines.  2.  Tobacco abuse.  3.  Rhinitis.   PLAN:  1.  We discussed and prescribed Chantix with smoking cessation after this.  2.  Allergy vaccine risk, policy, Epinephrine anaphylaxis and wavier      discussion.  3.  Sample trials Symbicort 80, 2 puffs b.i.d.  4.  Schedule return in 3 months, earlier p.r.n.                                   Clinton D. Maple Hudson, MD, FCCP, FACP   CDY/MedQ  DD:  02/25/2006  DT:  02/26/2006  Job #:  [id number]

## 2010-12-21 NOTE — Assessment & Plan Note (Signed)
[facility]                             PULMONARY OFFICE NOTE   NAME:Levandoski, ORDELL ERIC                MRN:          [id number]  DATE:09/23/2006                            DOB:          [birth year]    PROBLEM:  1. Chronic asthma/chronic obstructive airways disease.  2. Pulmonary embolism after knee surgery 2004.  3. Allergic rhinitis.  4. Tobacco use.   HISTORY:  He says he is using about 3 cigarettes a day.  Took Avalox  from Dr. Chilton Si for bronchitis earlier this winter.  Still feels somewhat  congested.  Symbicort did not work very well, and he says it was too  expensive.  He is out of his nebulizer solution.  He is trying to use a  dust mask at work.  He dropped off of allergy vaccine.  Using only an  albuterol inhaler, and he would use his nebulizer machine if he had the  medication.   OBJECTIVE:  Weight 238 pounds.  BP 128/88.  Pulse regular at 90.  Room  air saturation 96%.  He has mild nasal congestion.  Wheezy cough.  Normal heart sounds.  No accessory muscle use.  No edema.   IMPRESSION:  Chronic asthma/chronic obstructive pulmonary disease with  some exacerbation.  Financial difficulties partly with medication  compliance.  There is an allergic component.  We will see how he does as  the spring season begins.   PLAN:  1. Smoking cessation was emphasized again.  2. Prednisone 8-day taper from 40 mg with steroid talk.  3. We refilled his albuterol inhaler and his albuterol nebulizer      solution.  4. Add Asmanex 1 puff b.i.d. with steroid talk.  5. Schedule return in 3 months, earlier p.r.n.     Clinton D. Maple Hudson, MD, Tonny Bollman, FACP  Electronically Signed    CDY/MedQ  DD: 09/27/2006  DT: 09/27/2006  Job #: [id number]   cc:   Erskine Speed, M.D.

## 2010-12-21 NOTE — H&P (Signed)
NAME:  Tyler [name], Tyler [name]                      ACCOUNT NO.:  [id number]   MEDICAL RECORD NO.:  [id number]                   PATIENT TYPE:  IPS   LOCATION:  0502                                 FACILITY:  [facility]   PHYSICIAN:  Geoffery Lyons, M.D.                   DATE OF BIRTH:  1971-08-01   DATE OF ADMISSION:  05/30/2002  DATE OF DISCHARGE:                         PSYCHIATRIC ADMISSION ASSESSMENT   DATE OF ASSESSMENT:  May 31, 2002   CHIEF COMPLAINT:  This was just a stupid I did because I had a bad day.   PATIENT IDENTIFICATION:  This 40 year old separated white male who is an  involuntary admission.  He goes by the name of Tyler [name].   HISTORY OF PRESENT ILLNESS:  This patient was involuntarily petitioned by  his mother after he wrote a suicide note to his wife.  His wife then  notified his mother.  When the officers arrived to serve the petition, they  found him with two loaded guns, apparently a shotgun and a handgun.  The  patient today denies any suicidal ideation at this time.  He is irritable  and resistant to interview.  He reports that he was under the influence of  alcohol when he wrote the suicide note to his wife and he regrets doing it,  states he feels that everything just came down on top of him all at once.  He denies any history of depression symptoms but does endorse a weight loss  of approximately 30 pounds in the past six months, which he reports is  intentional.  He does endorse feelings of sadness and stress, but he refuses  to elaborate further.  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  He denies any chronic problems  with alcohol abuse; however, his ex-wife had reported that he had been doing  heavy drinking over the past several weeks.   PAST PSYCHIATRIC HISTORY:  The patient has no prior treatment history for  depression or mental illness, no prior hospitalizations, no history of  suicide  attempts.   SUBSTANCE ABUSE HISTORY:  The patient, although he does have a history of  occasional abuse, he reports that this past episode of drinking was a one-  time only event, that he is not a chronic alcohol abuser.   PAST MEDICAL HISTORY:  The patient's primary care physician is Erskine Speed, M.D.  Medical problems include asthma and the patient does endorse  that he has been on planning on talking to his primary care physician about  his stress level.   MEDICATIONS:  None.   DRUG ALLERGIES:  OXYCODONE.   PHYSICAL EXAMINATION:  GENERAL:  The patient is resistant to the idea of a  physical examination at this time, so we will defer that a bit until he  becomes more cooperative.  VITAL SIGNS:  On admission to the unit, temperature 98, pulse 86,  respirations 18, blood pressure 124/76.  He is 6 feet 3 inches tall and  weighs 198 pounds.   LABORATORY DATA:  Diagnostic studies reveal essentially normal labs.  His  thyroid panel is currently pending.  CBC and metabolic panel are  unremarkable.   SOCIAL HISTORY:  The patient has a high school education.  He has been  married for the past five years.  He has one young daughter who is currently  living with her mother.  The patient himself is currently living alone.  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  He has no legal charges against him.  He has been separated from  his wife for at least a year and reports that they are in the process of  working through their separation.  He declines to offer any information or  input about his insights into the relationship.   FAMILY HISTORY:  Family history is remarkable for a grandfather who  committed suicide by a gunshot wound and a cousin who also committed suicide  by gunshot wound.   MENTAL STATUS EXAM:  This is a healthy appearing, fully alert male who is  fairly tall, of medium build.  He is in no acute distress.  He does have a   dull and restricted affect.  He is aloof and generally resistant to  interview.  Speech is normal.  Mood is depressed and irritable.  He has a  strong denial component regarding his level of depression and its effect  upon his life.  Thought process is logical without deficits.  He is positive  for suicidal ideation at this time as far as I can tell but without any  specific plan; no evidence of homicidal ideation, no psychosis.  Cognitive:  Intact and oriented x 3.  Intelligence is average to above average.  Insight: Poor.  Judgment and impulse control: Questionable.  The patient is  a poor historian, generally because of his resistance in interview.   ADMISSION DIAGNOSES:   AXIS I:  1. Depression, not otherwise specified.  2. Rule out ethyl alcohol abuse and dependence.   AXIS II:  Deferred.   AXIS III:  None.   AXIS IV:  Moderate grief secondary to marital separation.   AXIS V:  Current 22, past year 42.   INITIAL PLAN OF CARE:  Plan is to involuntarily admit the patient to treat  his depression, which will presume based on his suicidal ideation and  threats even while he was under the influence of alcohol, and hopefully to  alleviate any suicidal ideation.  We have talked with him about starting  Lexapro 5 mg p.o. daily and the risks and benefits and doing this and he is  agreeable to taking the medication.  Meanwhile, we have also given him  Ambien 10 mg h.s. p.r.n. for any insomnia and we will ask the case manager  to contact his mother for any of her concerns.  We have also made available  to him Librium 25 mg p.o. should he show any signs of alcohol withdrawal,  but to date, has not required any of this.   ESTIMATED LENGTH OF STAY:  Two to three days.     Margaret A. Scott, N.P.                   Geoffery Lyons, M.D.     MAS/MEDQ  D:  06/01/2002  T:  06/01/2002  Job:  [id number]

## 2010-12-21 NOTE — Assessment & Plan Note (Signed)
[facility]                             PULMONARY OFFICE NOTE   NAME:Tyler [name], Tyler [name]                MRN:          [id number]  DATE:12/29/2006                            DOB:          28-Aug-1970    PROBLEM:  1. Chronic asthma/chronic obstructive pulmonary disease.  2. Pulmonary embolism after knee surgery in 2004.  3. Allergic rhinitis.  4. Tobacco use.  5. Rhinitis medicamentosa.   HISTORY:  Mild nasal congestion, chest congestion.  He has worked  outdoors most of the weekend with a lot of exposure to trees and dust  which has caused some increased nasal congestion that he is treating  with DayQuil.  He reminds me of history of nasal fracture x4.  He has  poison ivy rash on his arms and asks a prednisone taper to deal with  that.  Chest tightness a little in the evening.  He continues allergy  vaccine at 1:10 with no problems.  He has not needed to use his Epipen.  We reviewed these issues again.  He is being very cautious and  appropriate about use of his over-the-counter nasal decongestant spray  now.  I reminded him of my concern with that.  He denies any daily  medications and still has an albuterol rescue inhaler for p.r.n. use.  No medication allergy.   OBJECTIVE:  VITAL SIGNS:  Weight 242 pounds, blood pressure 122/82,  pulse 74, room air saturation 97%, mild hoarseness.  HEENT:  Tonsils are present without inflammation.  Pharynx is clear.  There is no wheeze and no postnasal drip.  He does have rash consistent  with poison ivy on his forearms bilaterally.   IMPRESSION:  1. Rhinitis.  2. Asthma with chronic obstructive pulmonary disease.  3. Poison ivy dermatitis.   PLAN:  We talked about ENT referral as an option because of his nasal  fractures.  He will use a decongestant p.r.n. and will watch to see how  that goes.  Schedule pulmonary function tests for comparison with  December of 2006.  Prednisone 8-day taper from 40 mg  with steroid talk,  for poison ivy.  Schedule return in 4 months, earlier p.r.n.     Clinton D. Maple Hudson, MD, Tonny Bollman, FACP  Electronically Signed    CDY/MedQ  DD: 12/29/2006  DT: 12/29/2006  Job #: [id number]   cc:   Erskine Speed, M.D.

## 2010-12-21 NOTE — Assessment & Plan Note (Signed)
[facility]                               PULMONARY OFFICE NOTE   NAME:Tyler [name], Tyler [name]                MRN:          [id number]  DATE:05/26/2006                            DOB:          11/18/1970    PULMONARY FOLLOWUP   PROBLEM LIST:  1. Chronic asthma/obstructive airways disease.  2. Pulmonary embolism after knee surgery in 2004.  3. Allergic rhinitis.  4. Tobacco use.   HISTORY:  He continues allergy vaccine with no problems at 1:10. We reviewed  his lack of success with Foradil and Symbicort. Expense with Symbicort was a  problem. For the past 2 weeks, he has had increased nose and chest  congestion. He got a Z-Pak from Dr. Chilton Si. Nasal discharge has all been  clear. He says he did not try the Chantix but has cut way down on his  cigarette smoking. He declined a flu shot. Symbicort apparently works but is  too expensive. I found out as he left that he carries an Afrin dispenser  with him so we discussed rhinitis medicamentosa.   OBJECTIVE:  VITAL SIGNS:  Weight 240 pounds, BP 128/82, pulse regular 86,  room air saturation 99%. There is mild red turbinate edema, bilateral and  expiratory wheeze, unlabored without cough.  HEENT:  No neck vein distention or stridor.  HEART:  Sounds are regular and normal.   IMPRESSION:  1. Chronic asthma with a fixed obstructive component. We need to work      harder towards a maintenance antiinflammatory.  2. Rhinitis with both allergic and medications induced components.   PLAN:  1. I discussed the availability of ENT surgery referral to look at      mechanically improving his nasal airway and he was interested in his      option. He understands that it want replace the issues of maintenance      therapy, environmental precaution and appropriate limited use of nasal      decongestants but nasal congestion seems to drive a lot of his      complaints and mechanical fix at his age might be  worthwhile.  2. I heavily again emphasized smoking cessation. I think everything would      be better if he stopped.  3. I have give him samples of Symbicort 160. He will hang on to it for use      during his next      exacerbation and see if that strategy works.  4. Schedule return in 4 months, earlier p.r.n.     Clinton D. Maple Hudson, MD, [facility], FACP    CDY/MedQ  DD: 05/31/2006  DT: 06/02/2006  Job #: [id number]   cc:   Erskine Speed, M.D.

## 2010-12-21 NOTE — Discharge Summary (Signed)
NAME:  Tyler [name], Tyler [name]                      ACCOUNT NO.:  [id number]   MEDICAL RECORD NO.:  [id number]                   PATIENT TYPE:  IPS   LOCATION:  0502                                 FACILITY:  [facility]   PHYSICIAN:  Geoffery Lyons, M.D.                   DATE OF BIRTH:  Dec 18, 1970   DATE OF ADMISSION:  05/30/2002  DATE OF DISCHARGE:  06/01/2002                                 DISCHARGE SUMMARY   CHIEF COMPLAINT AND PRESENTING ILLNESS:  This was first admission  to [facility]  for this 40 year old separated white male who was  involuntarily committed.  He was involuntarily committed by his mother after  he wrote a suicide note to his wife.  His wife then notified his mother.  When the police officers arrived to serve the petition, they found him with  2 loaded guns, a shotgun and a handgun.  He denied any suicidal ideas at  that particular time.  He was initially resistant and irritable, admitted he  was under the influence of alcohol when he wrote the suicide note and  regretted doing it.  Denies history of depression, does endorse weight loss  of 30 pounds in the past 6 months prior to this admission.  He reported it  was intentional.  Feelings of sadness expressed.  His ex-wife reported he  had been doing heavy drinking over the past several weeks before this  admission.   PAST PSYCHIATRIC HISTORY:  No previous treatment.   ALCOHOL AND DRUG HISTORY:  History of occasional abuse of alcohol, although  he minimized the use.  No other substances.   PAST MEDICAL HISTORY:  Asthma.   MEDICATIONS:  None.   PHYSICAL EXAMINATION:  Performed, failed to show any acute findings.   MENTAL STATUS EXAM:  Reveals a healthy-appearing, fully alert male, fairly  tall, medium build, no acute distress.  Dull, restricted affect, aloof,  generally resistant to interview.  Speech was normal, mood is depressed,  irritable.  He has strong denial component regarding his level of  depression  and his use of alcohol.  Thought process is logical, without deficit.  Positive for suicidal ideations at the time of the initial evaluation but  none at this particular time.  No homicidal ideas, no hallucinations.  Cognition well preserved.   ADMISSION DIAGNOSES:   AXIS I:  1. Major depression, single episode.  2. Alcohol abuse rule out dependence.   AXIS II:  No diagnosis.   AXIS III:  No diagnosis.   AXIS IV:  Moderate.   AXIS V:  Global assessment of function upon admission 25, highest global  assessment of function in past year 65.   LABORATORY DATA:  CBC was within normal limits.  Blood chemistries were  within normal limits.  Thyroid profile was within normal limits.   COURSE IN HOSPITAL:  He was admitted and started on intensive individual and  group psychotherapy.  He totally denied that he was trying to kill himself,  did admit to a lot of stress, pressure from work, separated from his wife,  increased demands from the family.  Did admit to drinking 6-8 beers that  night he wrote a suicide note.  Had difficult day and was under the  influence.  He regrets having written the note, denies being suicidal.  He  stated that his job knows now and they are going to help him out.  There was  some communication with the wife who felt that he was okay to be discharged.  He was going to follow up with Glendell Docker.  So on October 28, he was  in full contact with reality, no suicidal ideas, no homicidal ideas, willing  to follow up, so discharge was considered and granted.  He had been started  on Lexapro 10 mg per day that he tolerated quite well.   DISCHARGE DIAGNOSES:   AXIS I:  1. Depressive disorder not otherwise specified.  2. Alcohol abuse.   AXIS II:  No diagnosis.   AXIS III:  No diagnosis.   AXIS IV:  Moderate.   AXIS V:  Global assessment of function upon discharge 60.   DISCHARGE MEDICATIONS:  Lexapro 10 mg per day and albuterol  inhaler.   DISPOSITION:  To follow up with Glendell Docker for individual therapy and  Aleatha Borer for medication management.                                                Geoffery Lyons, M.D.    IL/MEDQ  D:  07/05/2002  T:  07/05/2002  Job:  [id number]

## 2011-03-02 ENCOUNTER — Other Ambulatory Visit: Payer: Self-pay | Admitting: Adult Health

## 2011-03-15 ENCOUNTER — Ambulatory Visit: Payer: PRIVATE HEALTH INSURANCE | Admitting: Internal Medicine

## 2011-04-10 ENCOUNTER — Telehealth: Payer: Self-pay | Admitting: Internal Medicine

## 2011-04-10 MED ORDER — PREDNISONE 10 MG PO TABS: ORAL_TABLET | ORAL | Status: DC

## 2011-04-10 NOTE — Telephone Encounter (Signed)
Per CY-okay to give Prednisone 10 mg #20 take 4 x 2 days, 3 x 2 days, 2 x 2 days, 1 x 2 days, then stop; no refills.    Pt is aware of RX sent.

## 2011-07-23 ENCOUNTER — Encounter: Payer: Self-pay | Admitting: Internal Medicine

## 2011-07-23 ENCOUNTER — Ambulatory Visit (INDEPENDENT_AMBULATORY_CARE_PROVIDER_SITE_OTHER): Payer: Managed Care, Other (non HMO) | Admitting: Internal Medicine

## 2011-07-23 VITALS — BP 116/80 | HR 78 | Ht 75.0 in | Wt 221.0 lb

## 2011-07-23 DIAGNOSIS — J309 Allergic rhinitis, unspecified: Secondary | ICD-10-CM

## 2011-07-23 DIAGNOSIS — F17200 Nicotine dependence, unspecified, uncomplicated: Secondary | ICD-10-CM

## 2011-07-23 MED ORDER — ALBUTEROL SULFATE HFA 108 (90 BASE) MCG/ACT IN AERS: 2.0000 | INHALATION_SPRAY | RESPIRATORY_TRACT | Status: DC | PRN

## 2011-07-23 MED ORDER — AMOXICILLIN-POT CLAVULANATE 875-125 MG PO TABS: 1.0000 | ORAL_TABLET | Freq: Two times a day (BID) | ORAL | Status: AC

## 2011-07-23 MED ORDER — METHYLPREDNISOLONE ACETATE 80 MG/ML IJ SUSP
80.0000 mg | Freq: Once | INTRAMUSCULAR | Status: AC
  Administered 2011-07-23: 80 mg via INTRAMUSCULAR

## 2011-07-23 MED ORDER — PHENYLEPHRINE HCL 1 % NA SOLN
3.0000 [drp] | Freq: Once | NASAL | Status: AC
  Administered 2011-07-23: 3 [drp] via NASAL

## 2011-07-23 NOTE — Patient Instructions (Signed)
Neb neo nasal  Depo 80  Script for [employer] refill  Script for augmentin antibiotic  Try taking a probiotic like Flora-Q or Align, or Activia yogurt, to help with stomach upset from antibiotics.

## 2011-07-25 ENCOUNTER — Encounter: Payer: Self-pay | Admitting: Internal Medicine

## 2011-07-25 NOTE — Assessment & Plan Note (Signed)
This is probably non-allergic rhinitis today, most likely viral but likely to progress to a bacterial sinusitis/ bronchitis. Plan- neosyn neb, depo 80, augmentin w/ probiotic

## 2011-07-25 NOTE — Assessment & Plan Note (Signed)
Counseling repeated- available resources for support, medical reasons for stopping now.

## 2011-07-25 NOTE — Progress Notes (Signed)
07/23/11- 40 yoM smoker followed for asthma/ COPD, allergic rhinitis, tobacco use, complicated by hx DVT LOV- 09/14/10  Continues to smoke against advice. Had flu vaccine. Acute visit- 3-4 days of tight sinus congestion w/o sore throat or fever. Chest is ok so far, w/o wheeze, cough or phlegm. He gets assistance for cost of Proair rescue inhaler and has a nebulizer, but has not stayed on maintenance steroid inhaler.   ROS-see HPI Constitutional:   No-   weight loss, night sweats, fevers, chills, fatigue, lassitude. HEENT:   No-  headaches, difficulty swallowing, tooth/dental problems, sore throat,       No-  sneezing, itching, ear ache,  +nasal congestion, post nasal drip,  CV:  No-   chest pain, orthopnea, PND, swelling in lower extremities, anasarca,                                  dizziness, palpitations Resp: No-   shortness of breath with exertion or at rest.              No-   productive cough,  No non-productive cough,  No- coughing up of blood.              No-   change in color of mucus.  No- wheezing.   Skin: No-   rash or lesions. GI:  No-   heartburn, indigestion, abdominal pain, nausea, vomiting, diarrhea,                 change in bowel habits, loss of appetite GU: . MS:  No-   joint pain or swelling.  No- decreased range of motion.  No- back pain. Neuro-     nothing unusual Psych:   OBJ General- Alert, Oriented, Affect-appropriate, Distress- none acute Skin- rash-none, lesions- none, excoriation- none Lymphadenopathy- none Head- atraumatic            Eyes- Gross vision intact, PERRLA, conjunctivae clear secretions            Ears- Hearing, canals-normal            Nose- turbinate edema, no-Septal dev, mucus, polyps, erosion, perforation             Throat- Mallampati II , mucosa clear , drainage- none, tonsils- atrophic Neck- flexible , trachea midline, no stridor , thyroid nl, carotid no bruit Chest - symmetrical excursion , unlabored           Heart/CV- RRR , no  murmur , no gallop  , no rub, nl s1 s2                           - JVD- none , edema- none, stasis changes- none, varices- none           Lung- clear to P&A, wheeze- none, cough- none , dullness-none, rub- none           Chest wall-  Abd- tender-no, distended-no, bowel sounds-present, HSM- no Br/ Gen/ Rectal- Not done, not indicated Extrem- cyanosis- none, clubbing, none, atrophy- none, strength- nl Neuro- grossly intact to observation

## 2011-11-19 ENCOUNTER — Telehealth: Payer: Self-pay | Admitting: Internal Medicine

## 2011-11-19 MED ORDER — PREDNISONE 10 MG PO TABS: ORAL_TABLET | ORAL | Status: DC

## 2011-11-19 MED ORDER — ALBUTEROL SULFATE HFA 108 (90 BASE) MCG/ACT IN AERS: 2.0000 | INHALATION_SPRAY | RESPIRATORY_TRACT | Status: DC | PRN

## 2011-11-19 NOTE — Telephone Encounter (Signed)
Ok proair inhaler # 1, 2 puffs, every 4 hours if needed, refill x 1 Ok Pred taper 10 mg, # 20   4 X 2 DAYS, 3 X 2 DAYS, 2 X 2 DAYS, 1 X 2 DAYS with 1 refill  Please make appointment ROV

## 2011-11-19 NOTE — Telephone Encounter (Signed)
Pt returned call. [phone]. Hazel Sams

## 2011-11-19 NOTE — Telephone Encounter (Signed)
Rx's sent to pharmacy. LMTCB

## 2011-11-19 NOTE — Telephone Encounter (Signed)
CY please advise. Looks as though patient was seen 07-2011 and told to follow up in 1 year. Thanks.

## 2011-11-20 NOTE — Telephone Encounter (Signed)
Pt is aware and picked up the medications last night. Nothing further is needed at this time.

## 2012-01-16 ENCOUNTER — Telehealth: Payer: Self-pay | Admitting: Internal Medicine

## 2012-01-16 MED ORDER — ALBUTEROL SULFATE HFA 108 (90 BASE) MCG/ACT IN AERS: 2.0000 | INHALATION_SPRAY | RESPIRATORY_TRACT | Status: DC | PRN

## 2012-01-16 NOTE — Telephone Encounter (Signed)
Rx sent and patient aware 

## 2012-02-21 ENCOUNTER — Telehealth: Payer: Self-pay | Admitting: Internal Medicine

## 2012-02-21 MED ORDER — ALBUTEROL SULFATE HFA 108 (90 BASE) MCG/ACT IN AERS: 2.0000 | INHALATION_SPRAY | RESPIRATORY_TRACT | Status: DC | PRN

## 2012-02-21 NOTE — Telephone Encounter (Signed)
Spoke with patient-aware that RX and refills sent to pharmacy and will call to make follow up with CY in December since it will be his yearly.

## 2012-02-24 NOTE — Telephone Encounter (Signed)
[facility] @ [phone], spoke with [facility].  Requested a refill on pt's Proair HFA be sent to the office.  Per Shawna Orleans, should arrive in 5-7 business days.  Per Florentina Addison, pt is aware.

## 2012-05-19 ENCOUNTER — Telehealth: Payer: Self-pay | Admitting: Internal Medicine

## 2012-05-19 MED ORDER — PREDNISONE 10 MG PO TABS: ORAL_TABLET | ORAL | Status: DC

## 2012-05-19 NOTE — Telephone Encounter (Signed)
Last OV 07/2011, next ov 07/22/12. I spoke with the pt and he states Saturday he was working outside and thinks he picked up some poison Ivy. Pt c/o red bumps on both arms and hands that are raised and itch. Pt states itching and rash is much worse since Saturday. PT is requesting some prednisone. Pt states his PCP is out of the office today so that is why he is calling here. Please advise. Carron Curie, CMA No Known Allergies

## 2012-05-19 NOTE — Telephone Encounter (Signed)
Per CY-okay to give patient Prednisone 10 mg #20 take 4 x 2 days, 3 x 2 days, 2 x 2 days, 1 x 2 days, then stop no refills. Pt aware that Rx has been sent to pharmacy.

## 2012-08-28 ENCOUNTER — Telehealth: Payer: Self-pay | Admitting: Internal Medicine

## 2012-08-28 MED ORDER — ALBUTEROL SULFATE HFA 108 (90 BASE) MCG/ACT IN AERS: 2.0000 | INHALATION_SPRAY | RESPIRATORY_TRACT | Status: DC | PRN

## 2012-08-28 NOTE — Telephone Encounter (Signed)
Spoke with patient, patient requesting refill on proair inhaler.  Rx sent to [facility].  Also, patient has not been seen in over a yr, so appt scheduled for Tues 09/22/12 @ 345pm.  Verified appt with pt and nothing further needed at this time.

## 2012-09-01 ENCOUNTER — Telehealth: Payer: Self-pay | Admitting: Internal Medicine

## 2012-09-01 NOTE — Telephone Encounter (Signed)
Called [facility], spoke with [place] who stated that this message can be disregarded as she verified that the rx was indeed received on 1.24.14.

## 2012-09-22 ENCOUNTER — Ambulatory Visit (INDEPENDENT_AMBULATORY_CARE_PROVIDER_SITE_OTHER): Payer: Managed Care, Other (non HMO) | Admitting: Internal Medicine

## 2012-09-22 ENCOUNTER — Encounter: Payer: Self-pay | Admitting: Internal Medicine

## 2012-09-22 VITALS — BP 128/76 | HR 99 | Ht 75.0 in | Wt 231.8 lb

## 2012-09-22 DIAGNOSIS — J449 Chronic obstructive pulmonary disease, unspecified: Secondary | ICD-10-CM

## 2012-09-22 DIAGNOSIS — F17200 Nicotine dependence, unspecified, uncomplicated: Secondary | ICD-10-CM

## 2012-09-22 MED ORDER — ALBUTEROL SULFATE (2.5 MG/3ML) 0.083% IN NEBU: 2.5000 mg | INHALATION_SOLUTION | Freq: Four times a day (QID) | RESPIRATORY_TRACT | Status: DC | PRN

## 2012-09-22 MED ORDER — ALBUTEROL SULFATE HFA 108 (90 BASE) MCG/ACT IN AERS: 2.0000 | INHALATION_SPRAY | RESPIRATORY_TRACT | Status: DC | PRN

## 2012-09-22 NOTE — Assessment & Plan Note (Signed)
Continued efforts at counseling and explanation.

## 2012-09-22 NOTE — Progress Notes (Signed)
07/23/11- 40 yoM smoker followed for asthma/ COPD, allergic rhinitis, tobacco use, complicated by hx DVT LOV- 09/14/10  Continues to smoke against advice. Had flu vaccine. Acute visit- 3-4 days of tight sinus congestion w/o sore throat or fever. Chest is ok so far, w/o wheeze, cough or phlegm. He gets assistance for cost of Proair rescue inhaler and has a nebulizer, but has not stayed on maintenance steroid inhaler.    09/22/12-  40 yoM smoker followed for asthma/ COPD, allergic rhinitis, tobacco use, complicated by hx DVT FOLLOWS FOR: denies any SOB, wheezing. head stays stopped up. Has cut down to only one or 2 cigarettes a day. We except that his progress and encouraged him to keep working to get off completely. He says he feels well but admits usually has some dry cough triggered by weather changes. Needs his routine meds refilled  ROS-see HPI Constitutional:   No-   weight loss, night sweats, fevers, chills, fatigue, lassitude. HEENT:   No-  headaches, difficulty swallowing, tooth/dental problems, sore throat,       No-  sneezing, itching, ear ache,  +nasal congestion, post nasal drip,  CV:  No-   chest pain, orthopnea, PND, swelling in lower extremities, anasarca,                                  dizziness, palpitations Resp: No-   shortness of breath with exertion or at rest.              No-   productive cough,  + non-productive cough,  No- coughing up of blood.              No-   change in color of mucus.  No- wheezing.   Skin: No-   rash or lesions. GI:  No-   heartburn, indigestion, abdominal pain, nausea, vomiting,  GU: . MS:  No-   joint pain or swelling.  Neuro-     nothing unusual Psych:   OBJ General- Alert, Oriented, Affect-appropriate, Distress- none acute Skin- rash-none, lesions- none, excoriation- none Lymphadenopathy- none Head- atraumatic            Eyes- Gross vision intact, PERRLA, conjunctivae clear secretions            Ears- Hearing, canals-normal  Nose- turbinate edema, no-Septal dev, mucus, polyps, erosion, perforation             Throat- Mallampati II , mucosa clear , drainage- none, tonsils- atrophic Neck- flexible , trachea midline, no stridor , thyroid nl, carotid no bruit Chest - symmetrical excursion , unlabored           Heart/CV- RRR , no murmur , no gallop  , no rub, nl s1 s2                           - JVD- none , edema- none, stasis changes- none, varices- none           Lung- +coarse, unlabored wheeze, cough- none , dullness-none, rub- none           Chest wall-  Abd-  Br/ Gen/ Rectal- Not done, not indicated Extrem- cyanosis- none, clubbing, none, atrophy- none, strength- nl Neuro- grossly intact to observation

## 2012-09-22 NOTE — Patient Instructions (Addendum)
Scripts sent for [employer] and for nebulizer medication to Drug store  Please call as needed

## 2012-09-22 NOTE — Assessment & Plan Note (Signed)
He also has a chronic bronchitis pattern now. I asked for chest x-ray and he declined, saying he had an appointment. Medications refilled with discussion.

## 2013-08-16 ENCOUNTER — Telehealth: Payer: Self-pay | Admitting: Internal Medicine

## 2013-08-16 MED ORDER — PREDNISONE 10 MG PO TABS: ORAL_TABLET | ORAL | Status: DC

## 2013-08-16 MED ORDER — ALBUTEROL SULFATE (2.5 MG/3ML) 0.083% IN NEBU: 2.5000 mg | INHALATION_SOLUTION | Freq: Four times a day (QID) | RESPIRATORY_TRACT | Status: DC | PRN

## 2013-08-16 MED ORDER — ALBUTEROL SULFATE HFA 108 (90 BASE) MCG/ACT IN AERS: 2.0000 | INHALATION_SPRAY | RESPIRATORY_TRACT | Status: DC | PRN

## 2013-08-16 MED ORDER — AMOXICILLIN-POT CLAVULANATE 875-125 MG PO TABS: 1.0000 | ORAL_TABLET | Freq: Two times a day (BID) | ORAL | Status: DC

## 2013-08-16 NOTE — Telephone Encounter (Signed)
Spoke with patient; aware of Rx's per CY and requested we refill his Proair HFA and albuterol nebulizer. Pt made appointment to see CY 09-23-13 at 9:00am.

## 2013-08-16 NOTE — Telephone Encounter (Signed)
Offer prednisone 10 mg, # 20, 4 X 2 DAYS, 3 X 2 DAYS, 2 X 2 DAYS, 1 X 2 DAYS           augmentin 875, # 14, 1 twice daily, ref x 1

## 2013-08-16 NOTE — Telephone Encounter (Signed)
Called and spoke with pt and he stated that he has the sinus congestion, chest congestion with clear sputum, no fever.  He stated that he is using his inhaler way too often.  He stated that he would like to have pred taper and abx called to the pharmacy.  He requests that a zpak NOT be called in for him since this does not work for him.  Pt stated that he will schedule an appt with CY for OV.   CY please advise. Thanks  Last ov--09/22/2012 No pending appts  No Known Allergies   Current Outpatient Prescriptions on File Prior to Visit  Medication Sig Dispense Refill  . albuterol (PROAIR HFA) 108 (90 BASE) MCG/ACT inhaler Inhale 2 puffs into the lungs every 4 (four) hours as needed for wheezing or shortness of breath.  1 Inhaler  prn  . albuterol (PROVENTIL) (2.5 MG/3ML) 0.083% nebulizer solution Take 3 mLs (2.5 mg total) by nebulization every 6 (six) hours as needed for wheezing or shortness of breath. Use as directed  75 mL  prn  . EPINEPHrine (EPIPEN 2-PAK) 0.3 mg/0.3 mL DEVI Inject 0.3 mg into the muscle once.         No current facility-administered medications on file prior to visit.

## 2013-09-23 ENCOUNTER — Ambulatory Visit (INDEPENDENT_AMBULATORY_CARE_PROVIDER_SITE_OTHER): Payer: Managed Care, Other (non HMO) | Admitting: Internal Medicine

## 2013-09-23 ENCOUNTER — Encounter: Payer: Self-pay | Admitting: Internal Medicine

## 2013-09-23 VITALS — BP 120/76 | HR 67 | Ht 75.0 in | Wt 237.4 lb

## 2013-09-23 DIAGNOSIS — J309 Allergic rhinitis, unspecified: Secondary | ICD-10-CM

## 2013-09-23 DIAGNOSIS — F17200 Nicotine dependence, unspecified, uncomplicated: Secondary | ICD-10-CM

## 2013-09-23 DIAGNOSIS — J45909 Unspecified asthma, uncomplicated: Secondary | ICD-10-CM

## 2013-09-23 DIAGNOSIS — J4489 Other specified chronic obstructive pulmonary disease: Secondary | ICD-10-CM

## 2013-09-23 DIAGNOSIS — J449 Chronic obstructive pulmonary disease, unspecified: Secondary | ICD-10-CM

## 2013-09-23 MED ORDER — PREDNISONE 10 MG PO TABS: ORAL_TABLET | ORAL | Status: DC

## 2013-09-23 MED ORDER — PHENYLEPHRINE HCL 1 % NA SOLN
3.0000 [drp] | Freq: Once | NASAL | Status: AC
  Administered 2013-09-23: 3 [drp] via NASAL

## 2013-09-23 MED ORDER — METHYLPREDNISOLONE ACETATE 80 MG/ML IJ SUSP
80.0000 mg | Freq: Once | INTRAMUSCULAR | Status: AC
  Administered 2013-09-23: 80 mg via INTRAMUSCULAR

## 2013-09-23 NOTE — Progress Notes (Signed)
07/23/11- 40 yoM smoker followed for asthma/ COPD, allergic rhinitis, tobacco use, complicated by hx DVT LOV- 09/14/10  Continues to smoke against advice. Had flu vaccine. Acute visit- 3-4 days of tight sinus congestion w/o sore throat or fever. Chest is ok so far, w/o wheeze, cough or phlegm. He gets assistance for cost of Proair rescue inhaler and has a nebulizer, but has not stayed on maintenance steroid inhaler.    09/22/12-  40 yoM smoker followed for asthma/ COPD, allergic rhinitis, tobacco use, complicated by hx DVT FOLLOWS FOR: denies any SOB, wheezing. head stays stopped up. Has cut down to only one or 2 cigarettes a day. We except that his progress and encouraged him to keep working to get off completely. He says he feels well but admits usually has some dry cough triggered by weather changes. Needs his routine meds refilled  09/23/13- 40 yoM smoker followed for asthma/ COPD, allergic rhinitis, tobacco use, complicated by hx DVT FOLLOWS FOR: needs another round(longer dose) of prednisone; still has refill on augmentin at pharmacy. Using nebulizer again. Continues to have flare ups of chest congetion, sinus trouble and wheezing. Flare of asthma/bronchitis as a viral syndrome after Christmas. We had called in prednisone and Augmentin. He felt well for 2 weeks. Now in the past week he is again acutely ill after being exposed to cold air and to a cat at his mother's house. At uses nebulizer machine twice this week. Admits to smoking 2 cigarettes per day-discussed.  ROS-see HPI Constitutional:   No-   weight loss, night sweats, fevers, chills, fatigue, lassitude. HEENT:   No-  headaches, difficulty swallowing, tooth/dental problems, sore throat,       No-  sneezing, itching, ear ache,  +nasal congestion, post nasal drip,  CV:  No-   chest pain, orthopnea, PND, swelling in lower extremities, anasarca,                                  dizziness, palpitations Resp: No-   shortness of breath with  exertion or at rest.              + productive cough,  + non-productive cough,  No- coughing up of blood.              No-   change in color of mucus.  + wheezing.   Skin: No-   rash or lesions. GI:  No-   heartburn, indigestion, abdominal pain, nausea, vomiting,  GU: . MS:  No-   joint pain or swelling.  Neuro-     nothing unusual  OBJ General- Alert, Oriented, Affect-appropriate, Distress- none acute Skin- rash-none, lesions- none, excoriation- none Lymphadenopathy- none Head- atraumatic            Eyes- Gross vision intact, PERRLA, conjunctivae clear secretions            Ears- Hearing, canals-normal            Nose- turbinate edema, no-Septal dev, mucus, polyps, erosion, perforation             Throat- Mallampati II , mucosa clear , drainage- none, tonsils- atrophic Neck- flexible , trachea midline, no stridor , thyroid nl, carotid no bruit Chest - symmetrical excursion , unlabored           Heart/CV- RRR , no murmur , no gallop  , no rub, nl s1 s2                           -  JVD- none , edema- none, stasis changes- none, varices- none           Lung- +coarse, unlabored wheeze, cough- none , dullness-none, rub- none           Chest wall-  Abd-  Br/ Gen/ Rectal- Not done, not indicated Extrem- cyanosis- none, clubbing, none, atrophy- none, strength- nl Neuro- grossly intact to observation

## 2013-09-23 NOTE — Patient Instructions (Signed)
Script prednisone taper sent  Sample Breo Ellipta   1 puff, then rinse mouth, once daily  Neb neo nasal  Depo 80

## 2013-10-17 NOTE — Assessment & Plan Note (Signed)
He reports smoking 2 cigarettes per day. This may be more habit and nicotine addiction. Offered support and emphasized importance of stopping completely.

## 2013-10-17 NOTE — Assessment & Plan Note (Signed)
Acute exacerbation of asthma/bronchitis Plan-nebulizer Xopenex, slow prednisone taper, Depo-Medrol, sample Breo Ellipta with discussion

## 2013-10-21 ENCOUNTER — Telehealth: Payer: Self-pay | Admitting: Internal Medicine

## 2013-10-21 MED ORDER — ALBUTEROL SULFATE HFA 108 (90 BASE) MCG/ACT IN AERS: 2.0000 | INHALATION_SPRAY | RESPIRATORY_TRACT | Status: DC | PRN

## 2013-10-21 NOTE — Telephone Encounter (Signed)
Pt requested refills on albuterol inhaler.  Per CY this is fine. Advised pt sent in request with additional refills. Nothing further needed

## 2013-10-21 NOTE — Telephone Encounter (Signed)
Rx has been sent in. Pt is aware. Nothing further is needed. 

## 2013-10-25 ENCOUNTER — Other Ambulatory Visit: Payer: Self-pay | Admitting: Internal Medicine

## 2013-12-01 ENCOUNTER — Telehealth: Payer: Self-pay | Admitting: Internal Medicine

## 2013-12-01 MED ORDER — ALBUTEROL SULFATE HFA 108 (90 BASE) MCG/ACT IN AERS: INHALATION_SPRAY | RESPIRATORY_TRACT | Status: DC

## 2013-12-01 NOTE — Telephone Encounter (Signed)
Rx sent to pharmacy to place on hold until patient calls for refills. Nothing more needed.

## 2014-01-21 ENCOUNTER — Ambulatory Visit: Payer: Managed Care, Other (non HMO) | Admitting: Internal Medicine

## 2014-03-04 ENCOUNTER — Telehealth: Payer: Self-pay | Admitting: Internal Medicine

## 2014-03-04 MED ORDER — ALBUTEROL SULFATE 108 (90 BASE) MCG/ACT IN AEPB: 2.0000 | INHALATION_SPRAY | RESPIRATORY_TRACT | Status: DC | PRN

## 2014-03-04 NOTE — Telephone Encounter (Signed)
Pt is aware that sample of Proair Respiclick is at front for pick up and to keep his appt with CY on 03-14-14. Nothing further needed at this time.

## 2014-03-14 ENCOUNTER — Encounter: Payer: Self-pay | Admitting: Internal Medicine

## 2014-03-14 ENCOUNTER — Ambulatory Visit (INDEPENDENT_AMBULATORY_CARE_PROVIDER_SITE_OTHER): Payer: Managed Care, Other (non HMO) | Admitting: Internal Medicine

## 2014-03-14 ENCOUNTER — Ambulatory Visit (INDEPENDENT_AMBULATORY_CARE_PROVIDER_SITE_OTHER)
Admission: RE | Admit: 2014-03-14 | Discharge: 2014-03-14 | Disposition: A | Discharge status: Home / Self Care | Payer: Managed Care, Other (non HMO) | Source: Ambulatory Visit | Attending: Internal Medicine | Admitting: Internal Medicine

## 2014-03-14 VITALS — BP 118/68 | HR 82 | Ht 75.0 in | Wt 246.0 lb

## 2014-03-14 DIAGNOSIS — J45909 Unspecified asthma, uncomplicated: Secondary | ICD-10-CM

## 2014-03-14 DIAGNOSIS — J449 Chronic obstructive pulmonary disease, unspecified: Secondary | ICD-10-CM

## 2014-03-14 MED ORDER — ALBUTEROL SULFATE 108 (90 BASE) MCG/ACT IN AEPB: 2.0000 | INHALATION_SPRAY | RESPIRATORY_TRACT | Status: DC | PRN

## 2014-03-14 NOTE — Patient Instructions (Addendum)
Order- Office spirometry      Dx asthma with bronchitis  Order- CXR         Dx asthma with bronchitis  Script for Proair rescue inhaler  Sample Anoro inhaler    1 puff once daily- maintenance inhaler

## 2014-03-14 NOTE — Progress Notes (Signed)
07/23/11- 40 yoM smoker followed for asthma/ COPD, allergic rhinitis, tobacco use, complicated by hx DVT LOV- 09/14/10  Continues to smoke against advice. Had flu vaccine. Acute visit- 3-4 days of tight sinus congestion w/o sore throat or fever. Chest is ok so far, w/o wheeze, cough or phlegm. He gets assistance for cost of Proair rescue inhaler and has a nebulizer, but has not stayed on maintenance steroid inhaler.    09/22/12-  40 yoM smoker followed for asthma/ COPD, allergic rhinitis, tobacco use, complicated by hx DVT FOLLOWS FOR: denies any SOB, wheezing. head stays stopped up. Has cut down to only one or 2 cigarettes a day. We except that his progress and encouraged him to keep working to get off completely. He says he feels well but admits usually has some dry cough triggered by weather changes. Needs his routine meds refilled  09/23/13- 40 yoM smoker followed for asthma/ COPD, allergic rhinitis, tobacco use, complicated by hx DVT FOLLOWS FOR: needs another round(longer dose) of prednisone; still has refill on augmentin at pharmacy. Using nebulizer again. Continues to have flare ups of chest congetion, sinus trouble and wheezing. Flare of asthma/bronchitis as a viral syndrome after Christmas. We had called in prednisone and Augmentin. He felt well for 2 weeks. Now in the past week he is again acutely ill after being exposed to cold air and to a cat at his mother's house. At uses nebulizer machine twice this week. Admits to smoking 2 cigarettes per day-discussed.  03/14/14- 43 yoM smoker followed for asthma/ COPD, allergic rhinitis, tobacco use, complicated by hx DVT FOLLOWS FOR: c/o "usual problems": sob with exertion, chest congestion, prod cough with mostly clear mucus, has been worse X few weeks.  Says he smoking about 2 cigarettes a day and we discussed ways to get off of the residual cigarettes. Baseline cough. Rarely needs his nebulizer machine. His work exposes him to outdoor weather  quite a lot. Office spirometry 03/14/14- moderate to severe obstructive airways disease  ROS-see HPI Constitutional:   No-   weight loss, night sweats, fevers, chills, fatigue, lassitude. HEENT:   No-  headaches, difficulty swallowing, tooth/dental problems, sore throat,       No-  sneezing, itching, ear ache,  +nasal congestion, post nasal drip,  CV:  No-   chest pain, orthopnea, PND, swelling in lower extremities, anasarca,                                  dizziness, palpitations Resp: No-   shortness of breath with exertion or at rest.              + productive cough,  + non-productive cough,  No- coughing up of blood.              No-   change in color of mucus.  + wheezing.   Skin: No-   rash or lesions. GI:  No-   heartburn, indigestion, abdominal pain, nausea, vomiting,  GU: . MS:  No-   joint pain or swelling.  Neuro-     nothing unusual  OBJ General- Alert, Oriented, Affect-appropriate, Distress- none acute Skin- rash-none, lesions- none, excoriation- none Lymphadenopathy- none Head- atraumatic            Eyes- Gross vision intact, PERRLA, conjunctivae clear secretions            Ears- Hearing, canals-normal  Nose- turbinate edema, no-Septal dev, mucus, polyps, erosion, perforation             Throat- Mallampati II , mucosa clear , drainage- none, tonsils- atrophic Neck- flexible , trachea midline, no stridor , thyroid nl, carotid no bruit Chest - symmetrical excursion , unlabored           Heart/CV- RRR , no murmur , no gallop  , no rub, nl s1 s2                           - JVD- none , edema- none, stasis changes- none, varices- none           Lung- +coarse, unlabored wheeze, cough- none , dullness-none, rub- none           Chest wall-  Abd-  Br/ Gen/ Rectal- Not done, not indicated Extrem- cyanosis- none, clubbing, none, atrophy- none, strength- nl Neuro- grossly intact to observation

## 2014-07-25 ENCOUNTER — Telehealth: Payer: Self-pay | Admitting: Internal Medicine

## 2014-07-25 MED ORDER — ALBUTEROL SULFATE HFA 108 (90 BASE) MCG/ACT IN AERS: 2.0000 | INHALATION_SPRAY | Freq: Four times a day (QID) | RESPIRATORY_TRACT | Status: DC | PRN

## 2014-07-25 MED ORDER — PREDNISONE 10 MG PO TABS: ORAL_TABLET | ORAL | Status: DC

## 2014-07-25 NOTE — Telephone Encounter (Signed)
Pt last seen 03-2014 and pending OV 09-2014. Pt states his insurance is requiring him to change from Proair respiclik to Ventolin HFA. Would liek to have [facility] sent to [facility]. Also, patient would like to have a Prednisone taper Rx on hand to help through the winter.   CY Please advise on both requests. Thanks.

## 2014-07-25 NOTE — Telephone Encounter (Signed)
Per CY-okay to give Prednisone 10 mg #20 take 4 x 2 days, 3 x 2 days, 2 x 2 days, 1 x 2 days then stop no refills as well as change proair to Ventolin HFA 2 puffs every 4 hours prn with prn refills.

## 2014-07-25 NOTE — Telephone Encounter (Signed)
Pt aware of cy's recs.  meds sent in.  Nothing further needed.

## 2014-08-30 ENCOUNTER — Encounter: Payer: Self-pay | Admitting: Internal Medicine

## 2014-08-30 NOTE — Assessment & Plan Note (Signed)
There is some baseline wheeze aggravated by persistent light smoking habit and chronic financial difficulty affording maintenance controller inhalers. Plan-continue deficits on smoking cessation. Reviewed medication options, use of steroids, rescue inhalers and available maintenance controller medications. Discussed financial access support.

## 2014-09-16 ENCOUNTER — Encounter: Payer: Self-pay | Admitting: Internal Medicine

## 2014-09-16 ENCOUNTER — Ambulatory Visit (INDEPENDENT_AMBULATORY_CARE_PROVIDER_SITE_OTHER): Payer: 59 | Admitting: Internal Medicine

## 2014-09-16 VITALS — BP 132/88 | HR 65 | Ht 75.0 in | Wt 253.0 lb

## 2014-09-16 DIAGNOSIS — J449 Chronic obstructive pulmonary disease, unspecified: Secondary | ICD-10-CM

## 2014-09-16 DIAGNOSIS — J0100 Acute maxillary sinusitis, unspecified: Secondary | ICD-10-CM

## 2014-09-16 DIAGNOSIS — I82409 Acute embolism and thrombosis of unspecified deep veins of unspecified lower extremity: Secondary | ICD-10-CM

## 2014-09-16 DIAGNOSIS — Z720 Tobacco use: Secondary | ICD-10-CM

## 2014-09-16 DIAGNOSIS — F17200 Nicotine dependence, unspecified, uncomplicated: Secondary | ICD-10-CM

## 2014-09-16 MED ORDER — FLUTICASONE FUROATE-VILANTEROL 100-25 MCG/INH IN AEPB: INHALATION_SPRAY | RESPIRATORY_TRACT | Status: DC

## 2014-09-16 MED ORDER — ALBUTEROL SULFATE HFA 108 (90 BASE) MCG/ACT IN AERS: 2.0000 | INHALATION_SPRAY | Freq: Four times a day (QID) | RESPIRATORY_TRACT | Status: DC | PRN

## 2014-09-16 MED ORDER — AMOXICILLIN-POT CLAVULANATE 875-125 MG PO TABS: 1.0000 | ORAL_TABLET | Freq: Two times a day (BID) | ORAL | Status: DC

## 2014-09-16 NOTE — Assessment & Plan Note (Signed)
Reinforced supportive counseling

## 2014-09-16 NOTE — Assessment & Plan Note (Signed)
Plan-smoking cessation, nasal saline rinse, watch need for antibiotic

## 2014-09-16 NOTE — Progress Notes (Signed)
07/23/11- 40 yoM smoker followed for asthma/ COPD, allergic rhinitis, tobacco use, complicated by hx DVT LOV- 09/14/10  Continues to smoke against advice. Had flu vaccine. Acute visit- 3-4 days of tight sinus congestion w/o sore throat or fever. Chest is ok so far, w/o wheeze, cough or phlegm. He gets assistance for cost of Proair rescue inhaler and has a nebulizer, but has not stayed on maintenance steroid inhaler.    09/22/12-  40 yoM smoker followed for asthma/ COPD, allergic rhinitis, tobacco use, complicated by hx DVT FOLLOWS FOR: denies any SOB, wheezing. head stays stopped up. Has cut down to only one or 2 cigarettes a day. We except that his progress and encouraged him to keep working to get off completely. He says he feels well but admits usually has some dry cough triggered by weather changes. Needs his routine meds refilled  09/23/13- 40 yoM smoker followed for asthma/ COPD, allergic rhinitis, tobacco use, complicated by hx DVT FOLLOWS FOR: needs another round(longer dose) of prednisone; still has refill on augmentin at pharmacy. Using nebulizer again. Continues to have flare ups of chest congetion, sinus trouble and wheezing. Flare of asthma/bronchitis as a viral syndrome after Christmas. We had called in prednisone and Augmentin. He felt well for 2 weeks. Now in the past week he is again acutely ill after being exposed to cold air and to a cat at his mother's house. At uses nebulizer machine twice this week. Admits to smoking 2 cigarettes per day-discussed.  03/14/14- 43 yoM smoker followed for asthma/ COPD, allergic rhinitis, tobacco use, complicated by hx DVT FOLLOWS FOR: c/o "usual problems": sob with exertion, chest congestion, prod cough with mostly clear mucus, has been worse X few weeks.  Says he smoking about 2 cigarettes a day and we discussed ways to get off of the residual cigarettes. Baseline cough. Rarely needs his nebulizer machine. His work exposes him to outdoor weather  quite a lot. Office spirometry 03/14/14- moderate to severe obstructive airways disease  09/16/14-43 yoM smoker followed for asthma/ COPD, allergic rhinitis, tobacco use, complicated by hx DVT FOLLOWS FOR: Pt c/o PND, sinus pressure and congestion,  Acute illness with 2 weeks of intermittent frontal headache, sinus congestion, clear mucus. Chest "okay" but he continues to smoke and uses only a rescue inhaler medication talk and smoking cessation discussed. CXR 03/14/14  IMPRESSION: No active cardiopulmonary disease. Electronically Signed  By: Maryclare Bean M.D.  On: 03/15/2014 08:10   ROS-see HPI Constitutional:   No-   weight loss, night sweats, fevers, chills, fatigue, lassitude. HEENT:   No-  headaches, difficulty swallowing, tooth/dental problems, sore throat,       No-  sneezing, itching, ear ache,  +nasal congestion, post nasal drip,  CV:  No-   chest pain, orthopnea, PND, swelling in lower extremities, anasarca,                                  dizziness, palpitations Resp: + shortness of breath with exertion or at rest.              + productive cough,  + non-productive cough,  No- coughing up of blood.              No-   change in color of mucus.  + wheezing.   Skin: No-   rash or lesions. GI:  No-   heartburn, indigestion, abdominal pain, nausea, vomiting,  GU: . MS:  No-   joint pain or swelling.  Neuro-     nothing unusual  OBJ General- Alert, Oriented, Affect-appropriate, Distress- none acute Skin- rash-none, lesions- none, excoriation- none Lymphadenopathy- none Head- atraumatic            Eyes- Gross vision intact, PERRLA, conjunctivae clear secretions            Ears- Hearing, canals-normal            Nose- +turbinate edema, no-Septal dev, mucus, polyps, erosion, perforation             Throat- Mallampati II , mucosa clear , drainage- none, tonsils- atrophic Neck- flexible , trachea midline, no stridor , thyroid nl, carotid no bruit Chest - symmetrical excursion ,  unlabored           Heart/CV- RRR , no murmur , no gallop  , no rub, nl s1 s2                           - JVD- none , edema- none, stasis changes- none, varices- none           Lung- +coarse, cough + wheezy , dullness-none, rub- none           Chest wall-  Abd-  Br/ Gen/ Rectal- Not done, not indicated Extrem- cyanosis- none, clubbing, none, atrophy- none, strength- nl Neuro- grossly intact to observation

## 2014-09-16 NOTE — Patient Instructions (Addendum)
Scripts sent for Ventolin and augmentin   2 Samples and printed script for Breo Ellipta maintenance inhaler                    1 puff then rinse mouth, once daily  Please keep trying to quit the cigarettes

## 2014-09-16 NOTE — Assessment & Plan Note (Signed)
Severe COPD with minimal improvement and he needs to use a maintenance inhaler. Education done. Samples of Breo  for trial

## 2014-09-16 NOTE — Assessment & Plan Note (Signed)
Reminded of need to avoid lower extremity stasis

## 2015-07-13 ENCOUNTER — Encounter: Payer: Self-pay | Admitting: Internal Medicine

## 2015-08-31 ENCOUNTER — Ambulatory Visit (INDEPENDENT_AMBULATORY_CARE_PROVIDER_SITE_OTHER): Payer: 59 | Admitting: Internal Medicine

## 2015-08-31 ENCOUNTER — Encounter: Payer: Self-pay | Admitting: Internal Medicine

## 2015-08-31 VITALS — BP 138/74 | HR 88 | Temp 98.1°F | Ht 75.0 in | Wt 250.0 lb

## 2015-08-31 DIAGNOSIS — J302 Other seasonal allergic rhinitis: Secondary | ICD-10-CM

## 2015-08-31 DIAGNOSIS — J45909 Unspecified asthma, uncomplicated: Secondary | ICD-10-CM

## 2015-08-31 DIAGNOSIS — F17200 Nicotine dependence, unspecified, uncomplicated: Secondary | ICD-10-CM

## 2015-08-31 DIAGNOSIS — J309 Allergic rhinitis, unspecified: Secondary | ICD-10-CM | POA: Diagnosis not present

## 2015-08-31 DIAGNOSIS — J449 Chronic obstructive pulmonary disease, unspecified: Secondary | ICD-10-CM | POA: Diagnosis not present

## 2015-08-31 DIAGNOSIS — J3089 Other allergic rhinitis: Secondary | ICD-10-CM

## 2015-08-31 MED ORDER — PREDNISONE 10 MG PO TABS: ORAL_TABLET | ORAL | Status: DC

## 2015-08-31 MED ORDER — METHYLPREDNISOLONE ACETATE 80 MG/ML IJ SUSP
80.0000 mg | Freq: Once | INTRAMUSCULAR | Status: AC
  Administered 2015-08-31: 80 mg via INTRAMUSCULAR

## 2015-08-31 MED ORDER — AMOXICILLIN-POT CLAVULANATE 875-125 MG PO TABS: 1.0000 | ORAL_TABLET | Freq: Two times a day (BID) | ORAL | Status: DC

## 2015-08-31 MED ORDER — FLUTICASONE FUROATE-VILANTEROL 100-25 MCG/INH IN AEPB: INHALATION_SPRAY | RESPIRATORY_TRACT | Status: DC

## 2015-08-31 MED ORDER — ALBUTEROL SULFATE HFA 108 (90 BASE) MCG/ACT IN AERS: 2.0000 | INHALATION_SPRAY | Freq: Four times a day (QID) | RESPIRATORY_TRACT | Status: DC | PRN

## 2015-08-31 MED ORDER — AZELASTINE-FLUTICASONE 137-50 MCG/ACT NA SUSP: NASAL | Status: DC

## 2015-08-31 NOTE — Progress Notes (Signed)
07/23/11- 40 yoM smoker followed for asthma/ COPD, allergic rhinitis, tobacco use, complicated by hx DVT LOV- 09/14/10  Continues to smoke against advice. Had flu vaccine. Acute visit- 3-4 days of tight sinus congestion w/o sore throat or fever. Chest is ok so far, w/o wheeze, cough or phlegm. He gets assistance for cost of Proair rescue inhaler and has a nebulizer, but has not stayed on maintenance steroid inhaler.    09/22/12-  40 yoM smoker followed for asthma/ COPD, allergic rhinitis, tobacco use, complicated by hx DVT FOLLOWS FOR: denies any SOB, wheezing. head stays stopped up. Has cut down to only one or 2 cigarettes a day. We except that his progress and encouraged him to keep working to get off completely. He says he feels well but admits usually has some dry cough triggered by weather changes. Needs his routine meds refilled  09/23/13- 40 yoM smoker followed for asthma/ COPD, allergic rhinitis, tobacco use, complicated by hx DVT FOLLOWS FOR: needs another round(longer dose) of prednisone; still has refill on augmentin at pharmacy. Using nebulizer again. Continues to have flare ups of chest congetion, sinus trouble and wheezing. Flare of asthma/bronchitis as a viral syndrome after Christmas. We had called in prednisone and Augmentin. He felt well for 2 weeks. Now in the past week he is again acutely ill after being exposed to cold air and to a cat at his mother's house. At uses nebulizer machine twice this week. Admits to smoking 2 cigarettes per day-discussed.  03/14/14- 43 yoM smoker followed for asthma/ COPD, allergic rhinitis, tobacco use, complicated by hx DVT FOLLOWS FOR: c/o "usual problems": sob with exertion, chest congestion, prod cough with mostly clear mucus, has been worse X few weeks.  Says he smoking about 2 cigarettes a day and we discussed ways to get off of the residual cigarettes. Baseline cough. Rarely needs his nebulizer machine. His work exposes him to outdoor weather  quite a lot. Office spirometry 03/14/14- moderate to severe obstructive airways disease  09/16/14-43 yoM smoker followed for asthma/ COPD, allergic rhinitis, tobacco use, complicated by hx DVT FOLLOWS FOR: Pt c/o PND, sinus pressure and congestion,  Acute illness with 2 weeks of intermittent frontal headache, sinus congestion, clear mucus. Chest "okay" but he continues to smoke and uses only a rescue inhaler medication talk and smoking cessation discussed. CXR 03/14/14  IMPRESSION: No active cardiopulmonary disease. Electronically Signed  By: Maryclare Bean M.D.  On: 03/15/2014 08:10  08/31/2015-45 year old male smoker followed for asthma/COPD, allergic rhinitis, tobacco use, complicated by history DVT ACUTE VISIT: pt c/o sinus congestion, pnd, chest congestion, mostly nonprod cough with some clear mucus, chills-intermittently X1 month.  Job now requires travel. Recent chest cold caught from sister. Dry cough. Chills 1 day. Cigarette smoking reduced to 6 or 8 cigarettes daily. Rare need for nebulizer machine.  ROS-see HPI Constitutional:   No-   weight loss, night sweats, fevers, chills, fatigue, lassitude. HEENT:   No-  headaches, difficulty swallowing, tooth/dental problems, sore throat,       No-  sneezing, itching, ear ache,  +nasal congestion, post nasal drip,  CV:  No-   chest pain, orthopnea, PND, swelling in lower extremities, anasarca,  dizziness, palpitations Resp: + shortness of breath with exertion or at rest.              + productive cough,  + non-productive cough,  No- coughing up of blood.              No-   change in color of mucus.  + wheezing.   Skin: No-   rash or lesions. GI:  No-   heartburn, indigestion, abdominal pain, nausea, vomiting,  GU: . MS:  No-   joint pain or swelling.  Neuro-     nothing unusual  OBJ General- Alert, Oriented, Affect-appropriate, Distress- none acute Skin- rash-none, lesions-  none, excoriation- none Lymphadenopathy- none Head- atraumatic            Eyes- Gross vision intact, PERRLA, conjunctivae clear secretions            Ears- Hearing, canals-normal            Nose- + sniffing, no-Septal dev, mucus, polyps, erosion, perforation             Throat- Mallampati II , mucosa clear , drainage- none, tonsils- atrophic Neck- flexible , trachea midline, no stridor , thyroid nl, carotid no bruit Chest - symmetrical excursion , unlabored           Heart/CV- RRR , no murmur , no gallop  , no rub, nl s1 s2                           - JVD- none , edema- none, stasis changes- none, varices- none           Lung- + clear, cough + dry , dullness-none, rub- none           Chest wall-  Abd-  Br/ Gen/ Rectal- Not done, not indicated Extrem- cyanosis- none, clubbing, none, atrophy- none, strength- nl Neuro- grossly intact to observation

## 2015-08-31 NOTE — Patient Instructions (Signed)
Depo 80  Sample and script to try Dymista nasal spray   1-2 puffs each nostril once daily at bed time   Script sent for augmentin and for refills of Breo and Ventolin

## 2015-09-03 ENCOUNTER — Encounter: Payer: Self-pay | Admitting: Internal Medicine

## 2015-09-03 NOTE — Assessment & Plan Note (Signed)
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 Recent viral pattern chest infection improving. Plan-refill rescue and maintenance inhalers with discussion

## 2015-09-03 NOTE — Assessment & Plan Note (Signed)
Has reduced smoking. Strongly encouraged complete abstinence.

## 2015-09-03 NOTE — Assessment & Plan Note (Signed)
Perennial sniffing which he largely ignores. Plan-try sample Dymista nasal spray

## 2015-09-19 ENCOUNTER — Ambulatory Visit: Payer: 59 | Admitting: Pulmonary Disease

## 2015-09-19 ENCOUNTER — Ambulatory Visit: Payer: 59 | Admitting: Internal Medicine

## 2015-12-08 ENCOUNTER — Encounter: Payer: Self-pay | Admitting: Internal Medicine

## 2015-12-15 ENCOUNTER — Telehealth: Payer: Self-pay | Admitting: Internal Medicine

## 2015-12-15 MED ORDER — PREDNISONE 10 MG PO TABS: ORAL_TABLET | ORAL | Status: DC

## 2015-12-15 NOTE — Telephone Encounter (Signed)
Pt is having flare up with his breathing at this time and would like to [facility]. Per CY-okay to give Pred taper as in 08-2015. Pt is aware Rx has been sent and nothing more needed at this time.

## 2016-02-07 ENCOUNTER — Telehealth: Payer: Self-pay | Admitting: Internal Medicine

## 2016-02-07 MED ORDER — PREDNISONE 10 MG PO TABS: ORAL_TABLET | ORAL | Status: DC

## 2016-02-07 NOTE — Telephone Encounter (Signed)
Per CY prednisone taper sent in to pt pharmacy. Pt aware & voices understanding. Nothing further needed

## 2016-02-07 NOTE — Telephone Encounter (Signed)
Spoke with pt and he states that he has some cough and chest tightness for the past few weeks. Pt has been using albuterol HFA several times a day with some relief. Pt also has gotten into some poison oak and has a slight rash. He is requesting a prednisone taper sent in to pharmacy.  CY Please advise. Thanks!  No Known Allergies  Current Outpatient Prescriptions on File Prior to Visit  Medication Sig Dispense Refill  . albuterol (PROVENTIL HFA;VENTOLIN HFA) 108 (90 Base) MCG/ACT inhaler Inhale 2 puffs into the lungs every 6 (six) hours as needed for wheezing or shortness of breath. 1 Inhaler prn  . albuterol (PROVENTIL) (2.5 MG/3ML) 0.083% nebulizer solution Take 3 mLs (2.5 mg total) by nebulization every 6 (six) hours as needed for wheezing or shortness of breath. Use as directed 75 mL prn  . amoxicillin-clavulanate (AUGMENTIN) 875-125 MG tablet Take 1 tablet by mouth 2 (two) times daily. 14 tablet 0  . Azelastine-Fluticasone (DYMISTA) 137-50 MCG/ACT SUSP 1-2 puffs each nostril once daily at bedtime 1 Bottle prn  . EPINEPHrine (EPIPEN 2-PAK) 0.3 mg/0.3 mL DEVI Inject 0.3 mg into the muscle once.      . Fluticasone Furoate-Vilanterol (BREO ELLIPTA) 100-25 MCG/INH AEPB 1 puff then rinse mouth, once daily 60 each prn  . predniSONE (DELTASONE) 10 MG tablet 4 X 2 DAYS, 3 X 2 DAYS, 2 X 2 DAYS, 1 X 2 DAYS 20 tablet 0   No current facility-administered medications on file prior to visit.

## 2016-02-07 NOTE — Telephone Encounter (Signed)
Prednisone 10 mg, # 20   4 X 2 DAYS, 3 X 2 DAYS, 2 X 2 DAYS, 1 X 2 DAYS  

## 2016-05-02 ENCOUNTER — Telehealth: Payer: Self-pay | Admitting: Internal Medicine

## 2016-05-02 MED ORDER — PREDNISONE 10 MG PO TABS: ORAL_TABLET | ORAL | 3 refills | Status: DC

## 2016-05-02 NOTE — Telephone Encounter (Signed)
Per CY-okay to give Rx for Pred taper as before with 3 refills.  Rx printed and signed by CY. Pt has taken rx with him. Nothing more needed at this time.

## 2016-09-02 ENCOUNTER — Ambulatory Visit: Payer: 59 | Admitting: Internal Medicine

## 2016-09-03 ENCOUNTER — Ambulatory Visit (INDEPENDENT_AMBULATORY_CARE_PROVIDER_SITE_OTHER): Payer: 59 | Admitting: Internal Medicine

## 2016-09-03 ENCOUNTER — Encounter: Payer: Self-pay | Admitting: Internal Medicine

## 2016-09-03 DIAGNOSIS — J302 Other seasonal allergic rhinitis: Secondary | ICD-10-CM

## 2016-09-03 DIAGNOSIS — J449 Chronic obstructive pulmonary disease, unspecified: Secondary | ICD-10-CM | POA: Diagnosis not present

## 2016-09-03 DIAGNOSIS — R21 Rash and other nonspecific skin eruption: Secondary | ICD-10-CM | POA: Diagnosis not present

## 2016-09-03 DIAGNOSIS — J3089 Other allergic rhinitis: Secondary | ICD-10-CM | POA: Diagnosis not present

## 2016-09-03 DIAGNOSIS — F17200 Nicotine dependence, unspecified, uncomplicated: Secondary | ICD-10-CM | POA: Diagnosis not present

## 2016-09-03 MED ORDER — FLUTICASONE FUROATE-VILANTEROL 100-25 MCG/INH IN AEPB: INHALATION_SPRAY | RESPIRATORY_TRACT | 99 refills | Status: DC

## 2016-09-03 MED ORDER — ALBUTEROL SULFATE HFA 108 (90 BASE) MCG/ACT IN AERS: 2.0000 | INHALATION_SPRAY | Freq: Four times a day (QID) | RESPIRATORY_TRACT | 99 refills | Status: DC | PRN

## 2016-09-03 NOTE — Patient Instructions (Signed)
Try an otc anti-fungal cream like Tinactin or Lotrimin consistently for 3 weeks for the skin rash. See if that clears it up.  Refills sent for Breo and the albuterol rescue inhaler.  I am really please you are serious on stopping smoking. I know you can do it!  Please call if we can help

## 2016-09-03 NOTE — Assessment & Plan Note (Signed)
Not wheezing at this visit. Meds seem to be controlling adequately. We may eventually consider change to a LABA/LAMA but current control is good. Finances have always been a limiting consideration but he is doing better.

## 2016-09-03 NOTE — Progress Notes (Signed)
HPI male smoker followed for asthma/COPD, allergic rhinitis, tobacco use, complicated by history DVT Office spirometry 03/14/14- moderate to severe obstructive airways disease  ---------------------------------------------------------------------------  09/16/14-43 yoM smoker followed for asthma/ COPD, allergic rhinitis, tobacco use, complicated by hx DVT FOLLOWS FOR: Pt c/o PND, sinus pressure and congestion,  Acute illness with 2 weeks of intermittent frontal headache, sinus congestion, clear mucus. Chest "okay" but he continues to smoke and uses only a rescue inhaler medication talk and smoking cessation discussed. CXR 03/14/14  IMPRESSION: No active cardiopulmonary disease. Electronically Signed  By: Maryclare Bean M.D.  On: 03/15/2014 08:10  08/31/2015-46 year old male smoker followed for asthma/COPD, allergic rhinitis, tobacco use, complicated by history DVT ACUTE VISIT: pt c/o sinus congestion, pnd, chest congestion, mostly nonprod cough with some clear mucus, chills-intermittently X1 month.  Job now requires travel. Recent chest cold caught from sister. Dry cough. Chills 1 day. Cigarette smoking reduced to 6 or 8 cigarettes daily. Rare need for nebulizer machine.  09/02/2016-46 year old male smoker followed for asthma/COPD, allergic rhinitis, tobacco use, complicated by history DVT FOLLOWS FOR: Pt states he continues to have wheezing at times; has noticed a round rash on back of his right calf.  He says he is cutting way down on smoking-just a few cigarettes daily and I strongly encouraged him to make it happen. He says he is breathing better and his sinuses are better with less smoke exposure. No serious infections this winter. Using Breo daily, rescue inhaler occasionally.  ROS-see HPI Constitutional:   No-   weight loss, night sweats, fevers, chills, fatigue, lassitude. HEENT:   No-  headaches, difficulty swallowing, tooth/dental problems, sore throat,       No-  sneezing, itching,  ear ache,  nasal congestion, post nasal drip,  CV:  No-   chest pain, orthopnea, PND, swelling in lower extremities, anasarca,                                                             dizziness, palpitations Resp: + shortness of breath with exertion or at rest.               productive cough,  + non-productive cough,  No- coughing up of blood.              No-   change in color of mucus.   wheezing.   Skin: + rash right calf GI:  No-   heartburn, indigestion, abdominal pain, nausea, vomiting,  GU: . MS:  No-   joint pain or swelling.  Neuro-     nothing unusual  OBJ General- Alert, Oriented, Affect-appropriate, Distress- none acute, + odor of tobacco Skin- + non-raised, confluent,  lightly scaling eczematoid rash without vesicles or raised border, dorsum R calf  Lymphadenopathy- none Head- atraumatic            Eyes- Gross vision intact, PERRLA, conjunctivae clear secretions            Ears- Hearing, canals-normal            Nose- clear, no-Septal dev, mucus, polyps, erosion, perforation             Throat- Mallampati II , mucosa clear , drainage- none, tonsils- atrophic Neck- flexible , trachea midline, no stridor , thyroid nl, carotid no bruit Chest -  symmetrical excursion , unlabored           Heart/CV- RRR , no murmur , no gallop  , no rub, nl s1 s2                           - JVD- none , edema- none, stasis changes- none, varices- none           Lung- + clear, cough- none , dullness-none, rub- none           Chest wall-  Abd-  Br/ Gen/ Rectal- Not done, not indicated Extrem- cyanosis- none, clubbing, none, atrophy- none, strength- nl Neuro- grossly intact to observation

## 2016-09-03 NOTE — Assessment & Plan Note (Signed)
A significant part of his rhinitis in recent years is nonallergic/irritant, related to smoking and improved as he decreases smoking.

## 2016-09-03 NOTE — Assessment & Plan Note (Signed)
Persistent rash back of right calf, mildly pruritic. Eczema vs tinea most likely.  Plan- start with otc antifungal cream. If if persists, may need steroid.

## 2016-09-03 NOTE — Assessment & Plan Note (Signed)
We talked again about strategies for smoking cessation. He says it's hard for him not to smoke a couple cigarettes after dinner but I do notice an odor of tobacco on his breath today. I did not challenge him on this issue.

## 2016-10-01 ENCOUNTER — Emergency Department (HOSPITAL_BASED_OUTPATIENT_CLINIC_OR_DEPARTMENT_OTHER)
Admit: 2016-10-01 | Discharge: 2016-10-01 | Disposition: A | Discharge status: Home / Self Care | Payer: 59 | Attending: Emergency Medicine | Admitting: Emergency Medicine

## 2016-10-01 ENCOUNTER — Emergency Department (HOSPITAL_COMMUNITY)
Admission: EM | Admit: 2016-10-01 | Discharge: 2016-10-01 | Disposition: A | Discharge status: Home / Self Care | Payer: 59 | Attending: Emergency Medicine | Admitting: Emergency Medicine

## 2016-10-01 ENCOUNTER — Encounter (HOSPITAL_COMMUNITY): Payer: Self-pay | Admitting: Emergency Medicine

## 2016-10-01 DIAGNOSIS — I82401 Acute embolism and thrombosis of unspecified deep veins of right lower extremity: Secondary | ICD-10-CM | POA: Diagnosis not present

## 2016-10-01 DIAGNOSIS — J449 Chronic obstructive pulmonary disease, unspecified: Secondary | ICD-10-CM | POA: Diagnosis not present

## 2016-10-01 DIAGNOSIS — M79604 Pain in right leg: Secondary | ICD-10-CM | POA: Diagnosis present

## 2016-10-01 DIAGNOSIS — I82411 Acute embolism and thrombosis of right femoral vein: Secondary | ICD-10-CM | POA: Diagnosis not present

## 2016-10-01 DIAGNOSIS — Z79899 Other long term (current) drug therapy: Secondary | ICD-10-CM | POA: Diagnosis not present

## 2016-10-01 DIAGNOSIS — M7989 Other specified soft tissue disorders: Secondary | ICD-10-CM

## 2016-10-01 DIAGNOSIS — F17210 Nicotine dependence, cigarettes, uncomplicated: Secondary | ICD-10-CM | POA: Insufficient documentation

## 2016-10-01 MED ORDER — RIVAROXABAN 15 MG PO TABS
15.0000 mg | ORAL_TABLET | Freq: Once | ORAL | Status: AC
  Administered 2016-10-01: 15 mg via ORAL
  Filled 2016-10-01: qty 1

## 2016-10-01 MED ORDER — RIVAROXABAN (XARELTO) VTE STARTER PACK (15 & 20 MG): ORAL_TABLET | ORAL | 0 refills | Status: DC

## 2016-10-01 NOTE — ED Notes (Addendum)
Patient currently in vas lab

## 2016-10-01 NOTE — ED Provider Notes (Signed)
[facility]-EMERGENCY DEPT Provider Note   CSN: [id number] Arrival date & time: 10/01/16  1646  By signing my name below, I, Sonum Patel, attest that this documentation has been prepared under the direction and in the presence of Gerhard Munchobert Lemar Bakos, MD. Electronically Signed: Sonum Patel, Neurosurgeoncribe. 10/01/16. 7:38 PM.  History   Chief Complaint Chief Complaint  Patient presents with  . Leg Pain    The history is provided by the patient. No language interpreter was used.     HPI Comments: Tyler [name] is a 46 y.o. male who presents to the Emergency Department complaining of intermittent right calf swelling since 2016 that worsened yesterday. Patient states he had a right calf injury in 2016 and several months afterwards he began to have intermittent right calf swelling. He states this would typically happen after long days on his feet and would resolve overnight while sleeping. He denies having associated pain at any point. He states yesterday his right thigh began swelling with associated redness. He states this was unchanged when he woke this morning so he saw his PCP today. He was sent here for further evaluation of a DVT. He denies SOB, extremity weakness or numbness. He reports a history of a post-surgical PE 15 years ago.   Past Medical History:  Diagnosis Date  . Allergic rhinitis   . Chronic asthma   . COPD (chronic obstructive pulmonary disease) (HCC)   . DVT (deep venous thrombosis) (HCC) 2004   with pulmonary embolism after knee surgery    Patient Active Problem List   Diagnosis Date Noted  . Rash and nonspecific skin eruption 09/03/2016  . Acute maxillary sinusitis 09/16/2014  . TOBACCO ABUSE 08/10/2007  . Thromboembolism of deep veins of lower extremity- hx 08/10/2007  . Seasonal and perennial allergic rhinitis 08/10/2007  . Asthma with COPD (chronic obstructive pulmonary disease) (HCC) 08/10/2007    Past Surgical History:  Procedure Laterality Date  . KNEE SURGERY       acl  . TENDON REPAIR     rt wrist       Home Medications    Prior to Admission medications   Medication Sig Start Date End Date Taking? Authorizing Provider  albuterol (PROVENTIL HFA;VENTOLIN HFA) 108 (90 Base) MCG/ACT inhaler Inhale 2 puffs into the lungs every 6 (six) hours as needed for wheezing or shortness of breath. 09/03/16   Waymon Budgelinton D Young, MD  albuterol (PROVENTIL) (2.5 MG/3ML) 0.083% nebulizer solution Take 3 mLs (2.5 mg total) by nebulization every 6 (six) hours as needed for wheezing or shortness of breath. Use as directed 08/16/13   Waymon Budgelinton D Young, MD  amoxicillin-clavulanate (AUGMENTIN) 875-125 MG tablet Take 1 tablet by mouth 2 (two) times daily. 08/31/15   Waymon Budgelinton D Young, MD  Azelastine-Fluticasone [facility](DYMISTA) 137-50 MCG/ACT SUSP 1-2 puffs each nostril once daily at bedtime 08/31/15   Waymon Budgelinton D Young, MD  EPINEPHrine (EPIPEN 2-PAK) 0.3 mg/0.3 mL DEVI Inject 0.3 mg into the muscle once.      Historical Provider, MD  fluticasone furoate-vilanterol (BREO ELLIPTA) 100-25 MCG/INH AEPB 1 puff then rinse mouth, once daily 09/03/16   Waymon Budgelinton D Young, MD  predniSONE (DELTASONE) 10 MG tablet 4 X 2 DAYS, 3 X 2 DAYS, 2 X 2 DAYS, 1 X 2 DAYS 05/02/16   Waymon Budgelinton D Young, MD    Family History Family History  Problem Relation Age of Onset  . Diabetes Mother     Social History Social History  Substance Use Topics  . Smoking status: Current  Every Day Smoker    Packs/day: 0.30    Years: 30.00    Types: Cigarettes  . Smokeless tobacco: Never Used     Comment: 3 cigarettes daily 08/31/15  . Alcohol use Not on file     Allergies   Patient has no known allergies.   Review of Systems Review of Systems  Constitutional:       Per HPI, otherwise negative  HENT:       Per HPI, otherwise negative  Respiratory:       Per HPI, otherwise negative  Cardiovascular:       Per HPI, otherwise negative  Gastrointestinal: Negative for vomiting.  Endocrine:       Negative aside from HPI   Genitourinary:       Neg aside from HPI   Musculoskeletal:       Per HPI, otherwise negative  Skin: Negative.   Neurological: Negative for syncope.     Physical Exam Updated Vital Signs BP 147/93 (BP Location: Left Arm)   Pulse 110   Temp 99.1 F (37.3 C) (Oral)   Resp 16   Ht 6\' 3"  (1.905 m)   Wt 247 lb (112 kg)   SpO2 96%   BMI 30.87 kg/m   Physical Exam  Constitutional: He is oriented to person, place, and time. He appears well-developed. No distress.  HENT:  Head: Normocephalic and atraumatic.  Eyes: Conjunctivae and EOM are normal.  Cardiovascular: Normal rate, regular rhythm and normal heart sounds.   Pulmonary/Chest: Effort normal and breath sounds normal. No stridor. No respiratory distress.  Abdominal: He exhibits no distension.  Musculoskeletal: He exhibits no edema.  Right leg grossly bigger than left without appreciable discoloration.   Neurological: He is alert and oriented to person, place, and time.  Skin: Skin is warm and dry.  Psychiatric: He has a normal mood and affect.  Nursing note and vitals reviewed.    ED Treatments / Results  DIAGNOSTIC STUDIES: Oxygen Saturation is 96% on RA, adequate by my interpretation.    COORDINATION OF CARE: 7:01 PM Discussed treatment plan with pt at bedside and pt agreed to plan.    Radiology DVT via [place] from common femoral to popliteal Procedures Procedures (including critical care time)  Medications Ordered in ED Medications  Rivaroxaban (XARELTO) tablet 15 mg (15 mg Oral Given 10/01/16 2016)     Initial Impression / Assessment and Plan / ED Course  I have reviewed the triage vital signs and the nursing notes.  Pertinent labs & imaging results that were available during my care of the patient were reviewed by me and considered in my medical decision making (see chart for details).  After the initial evaluation and review of ultrasound discussed the patient's case with our vascular surgeon. Patient has  been seen by vascular surgery, and discussed options for the lysis. Patient defers this option, would like to proceed with oral anticoagulants, follow-up as an outpatient. Absent evidence for PE, and withpreserved distal neurovascular status, patient will follow-up as an outpatient.    Final Clinical Impressions(s) / ED Diagnoses   Final diagnoses:  Acute deep vein thrombosis (DVT) of femoral vein of right lower extremity (HCC)    New Prescriptions New Prescriptions   RIVAROXABAN 15 & 20 MG TBPK    Take as directed on package: Start with one 15mg  tablet by mouth twice a day with food. On Day 22, switch to one 20mg  tablet once a day with food.    I personally  performed the services described in this documentation, which was scribed in my presence. The recorded information has been reviewed and is accurate.       Gerhard Munch, MD 10/01/16 2020

## 2016-10-01 NOTE — Consult Note (Signed)
Patient name: Tyler [name] MRN: [id number] DOB: 04-04-1971 Sex: male  REASON FOR CONSULT: Right lower extremity DVT. Consult is from the ED.  HPI: Tyler [name] is a 46 y.o. male, who developed the fairly sudden onset of right thigh swelling yesterday. He was seen by his primary care physician today and sent to the emergency department. A venous duplex scan showed a right lower extremity DVT and vascular surgery was consult.  This patient has had multiple procedures on his left knee. Most recently he had a procedure on the left knee 15 years ago and then several weeks later developed a pulmonary embolus. There was no history of DVT at that time that he is aware of. He was treated with warfarin for 3 months. He denies any other history of DVT. There is no family history of clotting disorders that he is aware of. His risk factors for clot formation include tobacco use and also he spends significant time traveling in his car. He states that he put in [address] [place] in 3 weeks.   Of note, in October 2016 he did injure his right calf at work and has had some intermittent calf swelling off and on for the last year. However the swelling that he developed yesterday in the right thigh was new.  He has no history of GI bleed, recent surgery, intracranial surgery or intracranial tumor.  He had smoked over 2 packs per day but has cut back to half a pack per day.  Past Medical History:  Diagnosis Date  . Allergic rhinitis   . Chronic asthma   . COPD (chronic obstructive pulmonary disease) (HCC)   . DVT (deep venous thrombosis) (HCC) 2004   with pulmonary embolism after knee surgery    Family History  Problem Relation Age of Onset  . Diabetes Mother     SOCIAL HISTORY: Social History   Social History  . Marital status: Single    Spouse name: N/A  . Number of children: N/A  . 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 Smoking status: Current Every Day Smoker    Packs/day: 0.30    Years: 30.00    Types: Cigarettes  . Smokeless tobacco: Never Used     Comment: 3 cigarettes daily 08/31/15  . Alcohol use Not on file  . Drug use: Unknown  . Sexual activity: Not on file   Other Topics Concern  . Not on file   Social History Narrative  . No narrative on file    No Known Allergies  Current Facility-Administered Medications  Medication Dose Route Frequency Provider Last Rate Last Dose  . Rivaroxaban (XARELTO) tablet 15 mg  15 mg Oral Once Gerhard Munch, MD       Current Outpatient Prescriptions  Medication Sig Dispense Refill  . albuterol (PROVENTIL HFA;VENTOLIN HFA) 108 (90 Base) MCG/ACT inhaler Inhale 2 puffs into the lungs every 6 (six) hours as needed for wheezing or shortness of breath. 1 Inhaler prn  . albuterol (PROVENTIL) (2.5 MG/3ML) 0.083% nebulizer solution Take 3 mLs (2.5 mg total) by nebulization every 6 (six) hours as needed for wheezing or shortness of breath. Use as directed 75 mL prn  . amoxicillin-clavulanate (AUGMENTIN) 875-125 MG tablet Take 1 tablet by mouth 2 (two) times daily. 14 tablet 0  . Azelastine-Fluticasone (DYMISTA) 137-50 MCG/ACT SUSP 1-2 puffs each nostril once daily at bedtime 1 Bottle prn  .  EPINEPHrine (EPIPEN 2-PAK) 0.3 mg/0.3 mL DEVI Inject 0.3 mg into the muscle once.      . fluticasone furoate-vilanterol (BREO ELLIPTA) 100-25 MCG/INH AEPB 1 puff then rinse mouth, once daily 60 each prn  . predniSONE (DELTASONE) 10 MG tablet 4 X 2 DAYS, 3 X 2 DAYS, 2 X 2 DAYS, 1 X 2 DAYS 20 tablet 3    REVIEW OF SYSTEMS:  [X]  denotes positive finding, [ ]  denotes negative finding Cardiac  Comments:  Chest pain or chest pressure:    Shortness of breath upon exertion:    Short of breath when lying flat:    Irregular heart rhythm:        Vascular    Pain in calf, thigh, or hip brought on by ambulation:    Pain in feet at night that wakes you up from  your sleep:     Blood clot in your veins:    Leg swelling:  X Right leg       Pulmonary    Oxygen at home:    Productive cough:     Wheezing:         Neurologic    Sudden weakness in arms or legs:     Sudden numbness in arms or legs:     Sudden onset of difficulty speaking or slurred speech:    Temporary loss of vision in one eye:     Problems with dizziness:         Gastrointestinal    Blood in stool:     Vomited blood:         Genitourinary    Burning when urinating:     Blood in urine:        Psychiatric    Major depression:         Hematologic    Bleeding problems:    Problems with blood clotting too easily:        Skin    Rashes or ulcers:        Constitutional    Fever or chills:      PHYSICAL EXAM: Vitals:   10/01/16 1652  BP: 147/93  Pulse: 110  Resp: 16  Temp: 99.1 F (37.3 C)  TempSrc: Oral  SpO2: 96%  Weight: 247 lb (112 kg)  Height: 6\' 3"  (1.905 m)    GENERAL: The patient is a well-nourished male, in no acute distress. The vital signs are documented above. CARDIAC: There is a regular rate and rhythm.  VASCULAR: I do not detect carotid bruits. He has palpable femoral pulses. I cannot palpate pedal pulses however both feet are warm and well-perfused. He has significant right lower extremity swelling which extends up to his hip. PULMONARY: There is good air exchange bilaterally without wheezing or rales. ABDOMEN: Soft and non-tender with normal pitched bowel sounds.  MUSCULOSKELETAL: There are no major deformities or cyanosis. NEUROLOGIC: No focal weakness or paresthesias are detected. SKIN: There are no ulcers or rashes noted. PSYCHIATRIC: The patient has a normal affect.  DATA:   VENOUS DUPLEX: I have reviewed the images and independently interpreted his venous duplex scan that was done today. He has acute thrombus in the femoral vein with partially occlusive thrombus in the popliteal vein. In addition he has clot in the posterior tibial  and peroneal veins.  MEDICAL ISSUES:  ACUTE DVT OF THE RIGHT LOWER EXTREMITY: This patient has an acute DVT of the right lower extremity involving the common femoral vein, popliteal vein, posterior tibial vein and  peroneal vein. We discussed treating this with [place] for 3 months with a follow up study at that time versus a more aggressive approach with thrombolysis. We discussed the advantages and disadvantages of each approach and he would like to not pursue thrombolysis. We have discussed the importance of intermittent leg elevation and proper positioning for this. I would be happy to see him back in 3 months with a follow up duplex scan. He will need a minimum of 3 months of Xeralto.   Waverly Ferrari [facility] [place] [facility] [phone]

## 2016-10-01 NOTE — Progress Notes (Signed)
**  Preliminary report by tech**  Bilateral lower extremity venous duplex complete. There is evidence of acute deep vein thrombosis involving the common femoral, femoral, popliteal, posterior tibial, and peroneal veins of the right lower extremity. There is no evidence of superficial vein thrombosis involving the right lower extremity. There is no evidence of deep or superficial vein thrombosis involving the left lower extremity. There is no evidence of a Baker's cyst bilaterally. Results given to the patient's nurse, Stacy.  10/01/16 6:27 PM Olen CordialGreg Mistina Coatney RVT

## 2016-10-01 NOTE — ED Triage Notes (Signed)
Pt to ER sent by PCP for evaluation of right leg pain and swelling on and off for the last year however the swelling is extending into right thigh onset 2 days ago. Pain with walking. Hx of PE 15 years ago. A/o x4.

## 2016-11-15 ENCOUNTER — Telehealth: Payer: Self-pay | Admitting: Internal Medicine

## 2016-11-15 MED ORDER — PREDNISONE 10 MG PO TABS: ORAL_TABLET | ORAL | 0 refills | Status: DC

## 2016-11-15 NOTE — Telephone Encounter (Signed)
Spoke with pt and informed him of MW recommendations. Pt understood and verified which pharmacy he wanted his medication sent to. His rx was sent. He had no further questions. Nothing further is needed

## 2016-11-15 NOTE — Telephone Encounter (Signed)
MW  Please Advise-Sick Message    Pt called in requesting if could send in Prednisone. Pt is c/o chet tightness,using his rescue inhaler a little more, slight cough with clear mucus,some chest congestion Denies fever, or any new breathing concerns   No Known Allergies

## 2016-11-15 NOTE — Telephone Encounter (Signed)
lmomtcb x1 

## 2016-11-15 NOTE — Telephone Encounter (Signed)
Prednisone 10 mg take  4 each am x 2 days,   2 each am x 2 days,  1 each am x 2 days and stop  

## 2016-11-15 NOTE — Telephone Encounter (Signed)
Patient returning call -he can be reached at [phone]-pr

## 2017-04-29 ENCOUNTER — Telehealth: Payer: Self-pay

## 2017-04-29 MED ORDER — PREDNISONE 10 MG PO TABS: ORAL_TABLET | ORAL | 0 refills | Status: DC

## 2017-04-29 NOTE — Telephone Encounter (Signed)
Spoke with pt, who requested Rx for prednisone for poison ivy on his left forearm. Per CY verbally-okay to prescribe prednisone  tabs. Start with 4 tabs daily and reduce by 2 every 2 days. Rx has been given to pt. Nothing further needed.

## 2017-09-03 ENCOUNTER — Ambulatory Visit (INDEPENDENT_AMBULATORY_CARE_PROVIDER_SITE_OTHER)
Admission: RE | Admit: 2017-09-03 | Discharge: 2017-09-03 | Disposition: A | Discharge status: Home / Self Care | Payer: 59 | Source: Ambulatory Visit | Attending: Internal Medicine | Admitting: Internal Medicine

## 2017-09-03 ENCOUNTER — Encounter: Payer: Self-pay | Admitting: Internal Medicine

## 2017-09-03 ENCOUNTER — Other Ambulatory Visit (INDEPENDENT_AMBULATORY_CARE_PROVIDER_SITE_OTHER): Payer: 59

## 2017-09-03 ENCOUNTER — Ambulatory Visit: Payer: 59 | Admitting: Internal Medicine

## 2017-09-03 VITALS — BP 130/76 | HR 71 | Ht 75.0 in | Wt 257.0 lb

## 2017-09-03 DIAGNOSIS — J45901 Unspecified asthma with (acute) exacerbation: Secondary | ICD-10-CM | POA: Diagnosis not present

## 2017-09-03 DIAGNOSIS — J449 Chronic obstructive pulmonary disease, unspecified: Secondary | ICD-10-CM

## 2017-09-03 DIAGNOSIS — J0101 Acute recurrent maxillary sinusitis: Secondary | ICD-10-CM

## 2017-09-03 LAB — CBC WITH DIFFERENTIAL/PLATELET
Basophils Absolute: 0 10*3/uL (ref 0.0–0.1)
Basophils Relative: 0.4 % (ref 0.0–3.0)
EOS ABS: 0.2 10*3/uL (ref 0.0–0.7)
EOS PCT: 2.2 % (ref 0.0–5.0)
HCT: 46.7 % (ref 39.0–52.0)
HEMOGLOBIN: 15.6 g/dL (ref 13.0–17.0)
LYMPHS PCT: 18.5 % (ref 12.0–46.0)
Lymphs Abs: 1.8 10*3/uL (ref 0.7–4.0)
MCHC: 33.5 g/dL (ref 30.0–36.0)
MCV: 89.6 fl (ref 78.0–100.0)
Monocytes Absolute: 0.7 10*3/uL (ref 0.1–1.0)
Monocytes Relative: 7 % (ref 3.0–12.0)
Neutro Abs: 7.1 10*3/uL (ref 1.4–7.7)
Neutrophils Relative %: 71.9 % (ref 43.0–77.0)
Platelets: 376 10*3/uL (ref 150.0–400.0)
RBC: 5.21 Mil/uL (ref 4.22–5.81)
RDW: 13.9 % (ref 11.5–15.5)
WBC: 9.9 10*3/uL (ref 4.0–10.5)

## 2017-09-03 MED ORDER — FLUTICASONE FUROATE-VILANTEROL 100-25 MCG/INH IN AEPB: INHALATION_SPRAY | RESPIRATORY_TRACT | 99 refills | Status: DC

## 2017-09-03 MED ORDER — METHYLPREDNISOLONE ACETATE 80 MG/ML IJ SUSP
80.0000 mg | Freq: Once | INTRAMUSCULAR | Status: AC
  Administered 2017-09-03: 80 mg via INTRAMUSCULAR

## 2017-09-03 MED ORDER — DOXYCYCLINE HYCLATE 100 MG PO TABS: 100.0000 mg | ORAL_TABLET | Freq: Two times a day (BID) | ORAL | 0 refills | Status: DC

## 2017-09-03 MED ORDER — ALBUTEROL SULFATE HFA 108 (90 BASE) MCG/ACT IN AERS: 2.0000 | INHALATION_SPRAY | Freq: Four times a day (QID) | RESPIRATORY_TRACT | 99 refills | Status: DC | PRN

## 2017-09-03 NOTE — Assessment & Plan Note (Signed)
Probable mild bilateral acute maxillary sinusitis, recurrent. Plan-doxycycline

## 2017-09-03 NOTE — Patient Instructions (Addendum)
Depo 80     Dx axthma exacerbation  Order- CXR     Dx asthma exacerbation              Labs- CBC w dif, Allergy profile  Please quit smoking- it is past time   Scripts printed for refills and for doxycycline antibiotic

## 2017-09-03 NOTE — Assessment & Plan Note (Signed)
Mild exacerbation probably related to recent viral upper respiratory infection and sinusitis. Plan-Depo-Medrol 80, CXR, CBC with differential, allergy profile for data update.  Refilled Breo and rescue inhaler

## 2017-09-03 NOTE — Progress Notes (Signed)
HPI male smoker followed for asthma/COPD, allergic rhinitis, tobacco use, complicated by history DVT Office spirometry 03/14/14- moderate to severe obstructive airways disease  ---------------------------------------------------------------------------  09/02/2016-47 year old male smoker followed for asthma/COPD, allergic rhinitis, tobacco use, complicated by history DVT FOLLOWS FOR: Pt states he continues to have wheezing at times; has noticed a round rash on back of his right calf.  He says he is cutting way down on smoking-just a few cigarettes daily and I strongly encouraged him to make it happen. He says he is breathing better and his sinuses are better with less smoke exposure. No serious infections this winter. Using Breo daily, rescue inhaler occasionally.  09/03/17- 47 year old male smoker followed for asthma/COPD, allergic rhinitis, tobacco use, complicated by history DVT      Asthma: Pt states he is having flare up with sinus-congested, slight dry cough, chest tightness. No more than usual wheezing. Needs refills on all meds.  Breo 100, neb albuterol, albuterol hfa, Dymista     off Xarelto now after 6 months. Says he just smokes 2 or 3 cigarettes a day.  Suspect sinus infection with pressure but no discharge, headache or fever, times 7-10 days.  In last 3 days chest is tighter.  Ordinarily uses rescue inhaler only 2 or 3 days/week.  ROS-see HPI    + = positive Constitutional:   No-   weight loss, night sweats, fevers, chills, fatigue, lassitude. HEENT:   No-  headaches, difficulty swallowing, tooth/dental problems, sore throat,       No-  sneezing, itching, ear ache,  +nasal congestion, post nasal drip,  CV:  No-   chest pain, orthopnea, PND, swelling in lower extremities, anasarca,                                                             dizziness, palpitations Resp: + shortness of breath with exertion or at rest.               productive cough,  + non-productive cough,  No-  coughing up of blood.              No-   change in color of mucus.   +wheezing.   Skin: + rash right calf GI:  No-   heartburn, indigestion, abdominal pain, nausea, vomiting,  GU: . MS:  No-   joint pain or swelling.  Neuro-     nothing unusual  OBJ General- Alert, Oriented, Affect-appropriate, Distress- none acute, + odor of tobacco again noted Skin- no rash Lymphadenopathy- none Head- atraumatic            Eyes- Gross vision intact, PERRLA, conjunctivae clear secretions            Ears- Hearing, canals-normal            Nose-+ mild turbinate edema, no-Septal dev, mucus, polyps, erosion, perforation             Throat- Mallampati II , mucosa clear , drainage- none, tonsils- atrophic Neck- flexible , trachea midline, no stridor , thyroid nl, carotid no bruit Chest - symmetrical excursion , unlabored           Heart/CV- RRR , no murmur , no gallop  , no rub, nl s1 s2                           -  JVD- none , edema- none, stasis changes- none, varices- none           Lung- + unlabored wheeze, cough- none , dullness-none, rub- none           Chest wall-  Abd-  Br/ Gen/ Rectal- Not done, not indicated Extrem- cyanosis- none, clubbing, none, atrophy- none, strength- nl Neuro- grossly intact to observation

## 2017-09-04 LAB — RESPIRATORY ALLERGY PROFILE REGION II ~~LOC~~
ALLERGEN, D PTERNOYSSINUS, D1: 17.3 kU/L — AB
Allergen, A. alternata, m6: <0.1 kU/L
Allergen, Comm Silver Birch, t9: <0.1 kU/L
Allergen, Cottonwood, t14: <0.1 kU/L
Allergen, Mouse Urine Protein, e78: <0.1 kU/L
Aspergillus fumigatus, m3: <0.1 kU/L
Bermuda Grass: <0.1 kU/L
Box Elder IgE: <0.1 kU/L
CAT DANDER: 17.9 kU/L — AB
CLASS: 0
CLASS: 0
CLASS: 0
CLASS: 0
CLASS: 0
CLASS: 0
CLASS: 0
CLASS: 0
CLASS: 0
CLASS: 0
CLASS: 2
CLASS: 2
CLASS: 3
CLASS: 4
Class: 0
Class: 0
Class: 0
Class: 0
Class: 0
Class: 0
Class: 0
Class: 0
Class: 0
Class: 4
Cockroach: 0.96 kU/L — ABNORMAL HIGH
D. FARINAE: 27.4 kU/L — AB
Dog Dander: 1.59 kU/L — ABNORMAL HIGH
Elm IgE: <0.1 kU/L
IGE (IMMUNOGLOBULIN E), SERUM: 205 kU/L — AB (ref <=114)
Pecan/Hickory Tree IgE: <0.1 kU/L
Rough Pigweed  IgE: <0.1 kU/L
Sheep Sorrel IgE: <0.1 kU/L

## 2017-09-04 LAB — INTERPRETATION:

## 2017-11-03 ENCOUNTER — Telehealth: Payer: Self-pay | Admitting: Internal Medicine

## 2017-11-03 MED ORDER — PREDNISONE 10 MG PO TABS: ORAL_TABLET | ORAL | 0 refills | Status: DC

## 2017-11-03 NOTE — Telephone Encounter (Signed)
Spoke with pt. States that he is not feeling well. Reports chest congestion, cough and sinus pressure. Cough is producing clear mucus. Denies chest tightness, wheezing, SOB or fever. Symptoms started 4 days ago. Has been taking OTC Benadryl Allergy with minimal relief. Would like CY's recommendations.  CY - please advise. Thanks.  No Known Allergies   Current Outpatient Medications on File Prior to Visit  Medication Sig Dispense Refill  . albuterol (PROVENTIL HFA;VENTOLIN HFA) 108 (90 Base) MCG/ACT inhaler Inhale 2 puffs into the lungs every 6 (six) hours as needed for wheezing or shortness of breath. 1 Inhaler prn  . albuterol (PROVENTIL) (2.5 MG/3ML) 0.083% nebulizer solution Take 3 mLs (2.5 mg total) by nebulization every 6 (six) hours as needed for wheezing or shortness of breath. Use as directed 75 mL prn  . Azelastine-Fluticasone (DYMISTA) 137-50 MCG/ACT SUSP 1-2 puffs each nostril once daily at bedtime 1 Bottle prn  . doxycycline (VIBRA-TABS) 100 MG tablet Take 1 tablet (100 mg total) by mouth 2 (two) times daily. 14 tablet 0  . EPINEPHrine (EPIPEN 2-PAK) 0.3 mg/0.3 mL DEVI Inject 0.3 mg into the muscle once.      . fluticasone furoate-vilanterol (BREO ELLIPTA) 100-25 MCG/INH AEPB 1 puff then rinse mouth, once daily 60 each prn  . Rivaroxaban 15 & 20 MG TBPK Take as directed on package: Start with one 15mg  tablet by mouth twice a day with food. On Day 22, switch to one 20mg  tablet once a day with food. 51 each 0   No current facility-administered medications on file prior to visit.

## 2017-11-03 NOTE — Telephone Encounter (Signed)
Offer prednisone 10 mg, # 20, 4 X 2 DAYS, 3 X 2 DAYS, 2 X 2 DAYS, 1 X 2 DAYS           Suggest Mucinex-DM  otc

## 2017-11-03 NOTE — Telephone Encounter (Signed)
Spoke with pt. He is aware of CY's recommendations. Rx has been sent in. Nothing further was needed. 

## 2017-11-26 ENCOUNTER — Telehealth: Payer: Self-pay | Admitting: Internal Medicine

## 2017-11-26 MED ORDER — PREDNISONE 10 MG PO TABS: ORAL_TABLET | ORAL | 0 refills | Status: DC

## 2017-11-26 NOTE — Telephone Encounter (Signed)
Spoke with patient. He is aware of Dr. Roxy CedarYoung's recommendations. Will go ahead and call in medication.   Nothing else needed at time of call.

## 2017-11-26 NOTE — Telephone Encounter (Signed)
Spoke with patient. He stated that he has been cleaning out his mother's house since Monday. Ever since then, he has noticed some tightness in his chest. He has a slight cough but it is not productive. Denies any fevers or body aches. His head feels stuffy as well.   He wishes to use [facility] on [address].   Patient stated that he [place] at the airport by 12 today.   CY, please advise. Thanks!

## 2017-11-26 NOTE — Telephone Encounter (Signed)
Offer prednisone 10 mg, # 20, 4 X 2 DAYS, 3 X 2 DAYS, 2 X 2 DAYS, 1 X 2 DAYS   Ok to refill inhalers as well for 12 months if he needs

## 2018-07-01 ENCOUNTER — Ambulatory Visit: Payer: 59 | Admitting: Pulmonary Disease

## 2018-09-03 ENCOUNTER — Ambulatory Visit: Payer: 59 | Admitting: Internal Medicine

## 2018-09-03 ENCOUNTER — Encounter: Payer: Self-pay | Admitting: Internal Medicine

## 2018-09-03 VITALS — BP 128/86 | HR 74 | Ht 75.0 in | Wt 267.0 lb

## 2018-09-03 DIAGNOSIS — F17200 Nicotine dependence, unspecified, uncomplicated: Secondary | ICD-10-CM

## 2018-09-03 DIAGNOSIS — J449 Chronic obstructive pulmonary disease, unspecified: Secondary | ICD-10-CM

## 2018-09-03 DIAGNOSIS — I82409 Acute embolism and thrombosis of unspecified deep veins of unspecified lower extremity: Secondary | ICD-10-CM | POA: Diagnosis not present

## 2018-09-03 MED ORDER — FLUTICASONE-UMECLIDIN-VILANT 100-62.5-25 MCG/INH IN AEPB: 1.0000 | INHALATION_SPRAY | Freq: Every day | RESPIRATORY_TRACT | 12 refills | Status: DC

## 2018-09-03 MED ORDER — FLUTICASONE-UMECLIDIN-VILANT 100-62.5-25 MCG/INH IN AEPB: 1.0000 | INHALATION_SPRAY | Freq: Every day | RESPIRATORY_TRACT | 1 refills | Status: DC

## 2018-09-03 MED ORDER — ALBUTEROL SULFATE HFA 108 (90 BASE) MCG/ACT IN AERS: 2.0000 | INHALATION_SPRAY | Freq: Four times a day (QID) | RESPIRATORY_TRACT | 99 refills | Status: DC | PRN

## 2018-09-03 NOTE — Progress Notes (Signed)
HPI male smoker followed for asthma/COPD, allergic rhinitis, tobacco use, complicated by history DVT Office spirometry 03/14/14- moderate to severe obstructive airways disease Office Spirometry 09/03/2018- Moderate obstructive airways disease.  FVC 4 0./67%, FEV1 2.4/51%, ratio 0.59, FEF 25-75% 1.4/34% ---------------------------------------------------------------------------  09/03/17- 48 year old male smoker followed for asthma/COPD, allergic rhinitis, tobacco use, complicated by history DVT      Asthma: Pt states he is having flare up with sinus-congested, slight dry cough, chest tightness. No more than usual wheezing. Needs refills on all meds.  Breo 100, neb albuterol, albuterol hfa, Dymista     off Xarelto now after 6 months. Says he just smokes 2 or 3 cigarettes a day.  Suspect sinus infection with pressure but no discharge, headache or fever, times 7-10 days.  In last 3 days chest is tighter.  Ordinarily uses rescue inhaler only 2 or 3 days/week.  09/03/2018- 48 year old male smoker followed for asthma/COPD, allergic rhinitis, tobacco use, complicated by history DVT -----Asthma with COPD-no complaints Admits to smoking 1 or 2 cigarettes daily.  Body weight 267 pounds Albuterol HFA, neb albuterol, Breo 100, Dymista Uses Breo about every 3 days. Han's used nebulizer in 2-3 years, finding Breo sufficient. No major flares in a long time. CXR 09/03/2017-  IMPRESSION: No active cardiopulmonary disease. Office Spirometry 09/03/2018- Moderate obstructive airways disease.  FVC 4 0./67%, FEV1 2.4/51%, ratio 0.59, FEF 25-75% 1.4/34%  ROS-see HPI    + = positive Constitutional:   No-   weight loss, night sweats, fevers, chills, fatigue, lassitude. HEENT:   No-  headaches, difficulty swallowing, tooth/dental problems, sore throat,       No-  sneezing, itching, ear ache,  +nasal congestion, post nasal drip,  CV:  No-   chest pain, orthopnea, PND, swelling in lower extremities, anasarca,                                                           dizziness, palpitations Resp: + shortness of breath with exertion or at rest.               productive cough,  + non-productive cough,  No- coughing up of blood.              No-   change in color of mucus.   +wheezing.   Skin:  GI:  No-   heartburn, indigestion, abdominal pain, nausea, vomiting,  GU: . MS:  No-   joint pain or swelling.  Neuro-     nothing unusual  OBJ General- Alert, Oriented, Affect-appropriate, Distress- none acute, + odor of tobacco again noted, + overweight Skin- no rash Lymphadenopathy- none Head- atraumatic            Eyes- Gross vision intact, PERRLA, conjunctivae clear secretions            Ears- Hearing, canals-normal            Nose-+ mild turbinate edema, no-Septal dev, mucus, polyps, erosion, perforation             Throat- Mallampati II , mucosa clear , drainage- none, tonsils- atrophic Neck- flexible , trachea midline, no stridor , thyroid nl, carotid no bruit Chest - symmetrical excursion , unlabored           Heart/CV- RRR , no murmur , no gallop  ,  no rub, nl s1 s2                           - JVD- none , edema- none, stasis changes- none, varices- none           Lung- + inspiratory wheeze, cough- none , dullness-none, rub- none           Chest wall-  Abd-  Br/ Gen/ Rectal- Not done, not indicated Extrem- cyanosis- none, clubbing, none, atrophy- none, strength- nl Neuro- grossly intact to observation

## 2018-09-03 NOTE — Patient Instructions (Signed)
Sample and script printed for Trelegy inhaler    Inhale 1 puff, then rinse mouth, once daily  Script printed for albuterol rescue inhaler  Please go ahead and quit that last few cigarettes. You have already smoked too much for one lifetime.  Please call if we can help

## 2018-11-02 NOTE — Assessment & Plan Note (Signed)
He only admits to a couple of cigarettes daily and I've strongly counseled him to quit.

## 2018-11-02 NOTE — Assessment & Plan Note (Addendum)
Recommend use of a maintenance controller med Plan- Trelegy

## 2018-11-02 NOTE — Assessment & Plan Note (Signed)
No recurrence but he mostly works from home so I've counseled him to avoid stasis.

## 2019-01-01 ENCOUNTER — Other Ambulatory Visit: Payer: Self-pay

## 2019-01-01 ENCOUNTER — Encounter (HOSPITAL_BASED_OUTPATIENT_CLINIC_OR_DEPARTMENT_OTHER): Payer: Self-pay

## 2019-01-01 ENCOUNTER — Emergency Department (HOSPITAL_BASED_OUTPATIENT_CLINIC_OR_DEPARTMENT_OTHER): Payer: 59

## 2019-01-01 ENCOUNTER — Encounter (HOSPITAL_COMMUNITY)
Admission: EM | Disposition: A | Discharge status: Home / Self Care | Payer: Self-pay | Source: Home / Self Care | Attending: Cardiology

## 2019-01-01 ENCOUNTER — Inpatient Hospital Stay (HOSPITAL_BASED_OUTPATIENT_CLINIC_OR_DEPARTMENT_OTHER)
Admission: EM | Admit: 2019-01-01 | Discharge: 2019-01-03 | DRG: 247 | Disposition: A | Discharge status: Home / Self Care | Payer: 59 | Attending: Cardiology | Admitting: Cardiology

## 2019-01-01 DIAGNOSIS — J302 Other seasonal allergic rhinitis: Secondary | ICD-10-CM | POA: Diagnosis not present

## 2019-01-01 DIAGNOSIS — I2510 Atherosclerotic heart disease of native coronary artery without angina pectoris: Secondary | ICD-10-CM | POA: Diagnosis present

## 2019-01-01 DIAGNOSIS — I213 ST elevation (STEMI) myocardial infarction of unspecified site: Secondary | ICD-10-CM | POA: Diagnosis present

## 2019-01-01 DIAGNOSIS — I2109 ST elevation (STEMI) myocardial infarction involving other coronary artery of anterior wall: Principal | ICD-10-CM | POA: Diagnosis present

## 2019-01-01 DIAGNOSIS — I472 Ventricular tachycardia: Secondary | ICD-10-CM | POA: Diagnosis not present

## 2019-01-01 DIAGNOSIS — J449 Chronic obstructive pulmonary disease, unspecified: Secondary | ICD-10-CM | POA: Diagnosis not present

## 2019-01-01 DIAGNOSIS — Z955 Presence of coronary angioplasty implant and graft: Secondary | ICD-10-CM

## 2019-01-01 DIAGNOSIS — R739 Hyperglycemia, unspecified: Secondary | ICD-10-CM | POA: Diagnosis present

## 2019-01-01 DIAGNOSIS — Z86718 Personal history of other venous thrombosis and embolism: Secondary | ICD-10-CM | POA: Diagnosis not present

## 2019-01-01 DIAGNOSIS — Z833 Family history of diabetes mellitus: Secondary | ICD-10-CM | POA: Diagnosis not present

## 2019-01-01 DIAGNOSIS — Z86711 Personal history of pulmonary embolism: Secondary | ICD-10-CM

## 2019-01-01 DIAGNOSIS — Z1159 Encounter for screening for other viral diseases: Secondary | ICD-10-CM

## 2019-01-01 DIAGNOSIS — Z7951 Long term (current) use of inhaled steroids: Secondary | ICD-10-CM

## 2019-01-01 DIAGNOSIS — E7800 Pure hypercholesterolemia, unspecified: Secondary | ICD-10-CM | POA: Diagnosis not present

## 2019-01-01 DIAGNOSIS — E6609 Other obesity due to excess calories: Secondary | ICD-10-CM | POA: Diagnosis not present

## 2019-01-01 DIAGNOSIS — Z6832 Body mass index (BMI) 32.0-32.9, adult: Secondary | ICD-10-CM | POA: Diagnosis not present

## 2019-01-01 DIAGNOSIS — F17210 Nicotine dependence, cigarettes, uncomplicated: Secondary | ICD-10-CM | POA: Diagnosis present

## 2019-01-01 DIAGNOSIS — Z720 Tobacco use: Secondary | ICD-10-CM | POA: Diagnosis not present

## 2019-01-01 DIAGNOSIS — Z79899 Other long term (current) drug therapy: Secondary | ICD-10-CM

## 2019-01-01 DIAGNOSIS — I255 Ischemic cardiomyopathy: Secondary | ICD-10-CM | POA: Diagnosis not present

## 2019-01-01 HISTORY — PX: LEFT HEART CATH AND CORONARY ANGIOGRAPHY: CATH118249

## 2019-01-01 HISTORY — PX: CORONARY STENT INTERVENTION: CATH118234

## 2019-01-01 HISTORY — PX: CORONARY THROMBECTOMY: CATH118304

## 2019-01-01 LAB — COMPREHENSIVE METABOLIC PANEL
ALT: 39 U/L (ref 0–44)
AST: 45 U/L — ABNORMAL HIGH (ref 15–41)
Albumin: 4.7 g/dL (ref 3.5–5.0)
Alkaline Phosphatase: 71 U/L (ref 38–126)
Anion gap: 14 (ref 5–15)
BUN: 14 mg/dL (ref 6–20)
CO2: 24 mmol/L (ref 22–32)
Calcium: 10 mg/dL (ref 8.9–10.3)
Chloride: 100 mmol/L (ref 98–111)
Creatinine, Ser: 1.16 mg/dL (ref 0.61–1.24)
GFR calc Af Amer: >60 mL/min (ref >60)
GFR calc non Af Amer: >60 mL/min (ref >60)
Glucose, Bld: 139 mg/dL — ABNORMAL HIGH (ref 70–99)
Potassium: 4 mmol/L (ref 3.5–5.1)
Sodium: 138 mmol/L (ref 135–145)
Total Bilirubin: 0.5 mg/dL (ref 0.3–1.2)
Total Protein: 8.7 g/dL — ABNORMAL HIGH (ref 6.5–8.1)

## 2019-01-01 LAB — CBC WITH DIFFERENTIAL/PLATELET
Abs Immature Granulocytes: 0.2 10*3/uL — ABNORMAL HIGH (ref 0.00–0.07)
Basophils Absolute: 0.1 10*3/uL (ref 0.0–0.1)
Basophils Relative: 1 %
Eosinophils Absolute: 0 10*3/uL (ref 0.0–0.5)
Eosinophils Relative: 0 %
HCT: 47.8 % (ref 39.0–52.0)
Hemoglobin: 15.6 g/dL (ref 13.0–17.0)
Immature Granulocytes: 1 %
Lymphocytes Relative: 8 %
Lymphs Abs: 1.2 10*3/uL (ref 0.7–4.0)
MCH: 29.4 pg (ref 26.0–34.0)
MCHC: 32.6 g/dL (ref 30.0–36.0)
MCV: 90 fL (ref 80.0–100.0)
Monocytes Absolute: 0.9 10*3/uL (ref 0.1–1.0)
Monocytes Relative: 6 %
Neutro Abs: 12.2 10*3/uL — ABNORMAL HIGH (ref 1.7–7.7)
Neutrophils Relative %: 84 %
Platelets: 333 10*3/uL (ref 150–400)
RBC: 5.31 MIL/uL (ref 4.22–5.81)
RDW: 13.7 % (ref 11.5–15.5)
WBC: 14.5 10*3/uL — ABNORMAL HIGH (ref 4.0–10.5)
nRBC: 0 % (ref 0.0–0.2)

## 2019-01-01 LAB — POCT I-STAT 7, (LYTES, BLD GAS, ICA,H+H)
Acid-base deficit: 1 mmol/L (ref 0.0–2.0)
Bicarbonate: 24.1 mmol/L (ref 20.0–28.0)
Calcium, Ion: 1.25 mmol/L (ref 1.15–1.40)
HCT: 45 % (ref 39.0–52.0)
Hemoglobin: 15.3 g/dL (ref 13.0–17.0)
O2 Saturation: 98 %
Potassium: 4.1 mmol/L (ref 3.5–5.1)
Sodium: 139 mmol/L (ref 135–145)
TCO2: 25 mmol/L (ref 22–32)
pCO2 arterial: 39.7 mmHg (ref 32.0–48.0)
pH, Arterial: 7.391 (ref 7.350–7.450)
pO2, Arterial: 103 mmHg (ref 83.0–108.0)

## 2019-01-01 LAB — APTT: aPTT: 104 seconds — ABNORMAL HIGH (ref 24–36)

## 2019-01-01 LAB — TROPONIN I: Troponin I: 0.94 ng/mL (ref <0.03)

## 2019-01-01 LAB — PROTIME-INR
INR: 1 (ref 0.8–1.2)
Prothrombin Time: 13 seconds (ref 11.4–15.2)

## 2019-01-01 LAB — POCT ACTIVATED CLOTTING TIME: Activated Clotting Time: 323 seconds

## 2019-01-01 LAB — POCT I-STAT CREATININE: Creatinine, Ser: 0.9 mg/dL (ref 0.61–1.24)

## 2019-01-01 LAB — SARS CORONAVIRUS 2 AG (30 MIN TAT): SARS Coronavirus 2 Ag: NEGATIVE

## 2019-01-01 SURGERY — LEFT HEART CATH AND CORONARY ANGIOGRAPHY
Anesthesia: LOCAL

## 2019-01-01 MED ORDER — HEPARIN SODIUM (PORCINE) 5000 UNIT/ML IJ SOLN: 60.0000 [IU]/kg | Freq: Once | INTRAMUSCULAR | Status: DC

## 2019-01-01 MED ORDER — ONDANSETRON HCL 4 MG/2ML IJ SOLN
INTRAMUSCULAR | Status: AC
  Administered 2019-01-01: 22:00:00
  Filled 2019-01-01: qty 2

## 2019-01-01 MED ORDER — SODIUM CHLORIDE 0.9 % IV SOLN
INTRAVENOUS | Status: DC
  Administered 2019-01-01: 21:00:00 via INTRAVENOUS

## 2019-01-01 MED ORDER — LIDOCAINE HCL (PF) 1 % IJ SOLN
INTRAMUSCULAR | Status: AC
  Filled 2019-01-01: qty 30

## 2019-01-01 MED ORDER — NITROGLYCERIN 0.4 MG SL SUBL
0.4000 mg | SUBLINGUAL_TABLET | SUBLINGUAL | Status: DC | PRN
  Administered 2019-01-01 (×2): 0.4 mg via SUBLINGUAL

## 2019-01-01 MED ORDER — VERAPAMIL HCL 2.5 MG/ML IV SOLN
INTRAVENOUS | Status: AC
  Filled 2019-01-01: qty 2

## 2019-01-01 MED ORDER — NITROGLYCERIN 0.4 MG SL SUBL: 0.4000 mg | SUBLINGUAL_TABLET | SUBLINGUAL | Status: DC | PRN

## 2019-01-01 MED ORDER — MIDAZOLAM HCL 2 MG/2ML IJ SOLN
INTRAMUSCULAR | Status: DC | PRN
  Administered 2019-01-01: 2 mg via INTRAVENOUS
  Administered 2019-01-01: 1 mg via INTRAVENOUS

## 2019-01-01 MED ORDER — ONDANSETRON HCL 4 MG/2ML IJ SOLN: 4.0000 mg | Freq: Four times a day (QID) | INTRAMUSCULAR | Status: DC | PRN

## 2019-01-01 MED ORDER — ASPIRIN EC 81 MG PO TBEC
81.0000 mg | DELAYED_RELEASE_TABLET | Freq: Every day | ORAL | Status: DC
  Administered 2019-01-02 – 2019-01-03 (×2): 81 mg via ORAL
  Filled 2019-01-01 (×2): qty 1

## 2019-01-01 MED ORDER — FENTANYL CITRATE (PF) 100 MCG/2ML IJ SOLN
INTRAMUSCULAR | Status: AC
  Filled 2019-01-01: qty 2

## 2019-01-01 MED ORDER — ATORVASTATIN CALCIUM 80 MG PO TABS
80.0000 mg | ORAL_TABLET | Freq: Every day | ORAL | Status: DC
  Administered 2019-01-02 (×2): 80 mg via ORAL
  Filled 2019-01-01 (×2): qty 1

## 2019-01-01 MED ORDER — NITROGLYCERIN 1 MG/10 ML FOR IR/CATH LAB
INTRA_ARTERIAL | Status: DC | PRN
  Administered 2019-01-01 (×2): 200 ug via INTRA_ARTERIAL

## 2019-01-01 MED ORDER — TICAGRELOR 90 MG PO TABS
ORAL_TABLET | ORAL | Status: DC | PRN
  Administered 2019-01-01: 180 mg via ORAL

## 2019-01-01 MED ORDER — TICAGRELOR 90 MG PO TABS
90.0000 mg | ORAL_TABLET | Freq: Two times a day (BID) | ORAL | Status: DC
  Administered 2019-01-02 – 2019-01-03 (×4): 90 mg via ORAL
  Filled 2019-01-01 (×4): qty 1

## 2019-01-01 MED ORDER — NITROGLYCERIN 0.4 MG SL SUBL
SUBLINGUAL_TABLET | SUBLINGUAL | Status: AC
  Filled 2019-01-01: qty 3

## 2019-01-01 MED ORDER — SODIUM CHLORIDE 0.9 % IV SOLN
INTRAVENOUS | Status: AC | PRN
  Administered 2019-01-01: 23:00:00 4 ug/kg/min via INTRAVENOUS

## 2019-01-01 MED ORDER — HEPARIN SODIUM (PORCINE) 1000 UNIT/ML IJ SOLN
INTRAMUSCULAR | Status: DC | PRN
  Administered 2019-01-01: 10000 [IU] via INTRAVENOUS

## 2019-01-01 MED ORDER — FENTANYL CITRATE (PF) 100 MCG/2ML IJ SOLN
INTRAMUSCULAR | Status: DC | PRN
  Administered 2019-01-01 (×2): 50 ug via INTRAVENOUS

## 2019-01-01 MED ORDER — HEPARIN SODIUM (PORCINE) 5000 UNIT/ML IJ SOLN
4000.0000 [IU] | Freq: Once | INTRAMUSCULAR | Status: AC
  Administered 2019-01-01: 21:00:00 4000 [IU] via INTRAVENOUS

## 2019-01-01 MED ORDER — HEPARIN (PORCINE) 25000 UT/250ML-% IV SOLN
1300.0000 [IU]/h | INTRAVENOUS | Status: DC
  Administered 2019-01-01: 1300 [IU]/h via INTRAVENOUS
  Filled 2019-01-01: qty 250

## 2019-01-01 MED ORDER — HEPARIN (PORCINE) IN NACL 1000-0.9 UT/500ML-% IV SOLN
INTRAVENOUS | Status: DC | PRN
  Administered 2019-01-01 (×2): 500 mL

## 2019-01-01 MED ORDER — ONDANSETRON HCL 4 MG/2ML IJ SOLN
4.0000 mg | Freq: Once | INTRAMUSCULAR | Status: AC
  Administered 2019-01-01: 22:00:00 4 mg via INTRAVENOUS

## 2019-01-01 MED ORDER — MIDAZOLAM HCL 2 MG/2ML IJ SOLN
INTRAMUSCULAR | Status: AC
  Filled 2019-01-01: qty 2

## 2019-01-01 MED ORDER — CARVEDILOL 6.25 MG PO TABS
6.2500 mg | ORAL_TABLET | Freq: Two times a day (BID) | ORAL | Status: DC
  Administered 2019-01-02: 6.25 mg via ORAL
  Filled 2019-01-01: qty 1

## 2019-01-01 MED ORDER — LIDOCAINE HCL (PF) 1 % IJ SOLN
INTRAMUSCULAR | Status: DC | PRN
  Administered 2019-01-01: 30 mL

## 2019-01-01 MED ORDER — CANGRELOR BOLUS VIA INFUSION
INTRAVENOUS | Status: DC | PRN
  Administered 2019-01-01: 23:00:00 3537 ug via INTRAVENOUS

## 2019-01-01 MED ORDER — TICAGRELOR 90 MG PO TABS
ORAL_TABLET | ORAL | Status: AC
  Filled 2019-01-01: qty 2

## 2019-01-01 MED ORDER — FLUTICASONE-UMECLIDIN-VILANT 100-62.5-25 MCG/INH IN AEPB: 1.0000 | INHALATION_SPRAY | Freq: Every day | RESPIRATORY_TRACT | Status: DC

## 2019-01-01 MED ORDER — NITROGLYCERIN 1 MG/10 ML FOR IR/CATH LAB
INTRA_ARTERIAL | Status: AC
  Filled 2019-01-01: qty 10

## 2019-01-01 MED ORDER — HEPARIN SODIUM (PORCINE) 5000 UNIT/ML IJ SOLN
INTRAMUSCULAR | Status: AC
  Administered 2019-01-01: 22:00:00
  Filled 2019-01-01: qty 1

## 2019-01-01 MED ORDER — VERAPAMIL HCL 2.5 MG/ML IV SOLN
INTRA_ARTERIAL | Status: DC | PRN
  Administered 2019-01-01: 23:00:00 10 mL via INTRA_ARTERIAL

## 2019-01-01 MED ORDER — HEPARIN (PORCINE) IN NACL 1000-0.9 UT/500ML-% IV SOLN
INTRAVENOUS | Status: AC
  Filled 2019-01-01: qty 500

## 2019-01-01 MED ORDER — IOHEXOL 350 MG/ML SOLN
INTRAVENOUS | Status: DC | PRN
  Administered 2019-01-01: 23:00:00 125 mL via INTRAVENOUS
  Administered 2019-01-01: 100 mL via INTRAVENOUS

## 2019-01-01 MED ORDER — CANGRELOR TETRASODIUM 50 MG IV SOLR
INTRAVENOUS | Status: AC
  Filled 2019-01-01: qty 50

## 2019-01-01 MED ORDER — HEPARIN SODIUM (PORCINE) 1000 UNIT/ML IJ SOLN
INTRAMUSCULAR | Status: AC
  Filled 2019-01-01: qty 1

## 2019-01-01 MED ORDER — ACETAMINOPHEN 325 MG PO TABS: 650.0000 mg | ORAL_TABLET | ORAL | Status: DC | PRN

## 2019-01-01 MED ORDER — ASPIRIN 81 MG PO CHEW
324.0000 mg | CHEWABLE_TABLET | Freq: Once | ORAL | Status: AC
  Administered 2019-01-01: 21:00:00 324 mg via ORAL
  Filled 2019-01-01: qty 4

## 2019-01-01 SURGICAL SUPPLY — 16 items
BALLN SAPPHIRE 2.5X12 (BALLOONS) ×2
BALLOON SAPPHIRE 2.5X12 (BALLOONS) IMPLANT
CATH EXTRAC PRONTO 5.5F 138CM (CATHETERS) ×1 IMPLANT
CATH INFINITI JR4 5F (CATHETERS) ×1 IMPLANT
CATH VISTA GUIDE 6FR XBLAD3.5 (CATHETERS) ×1 IMPLANT
DEVICE RAD COMP TR BAND LRG (VASCULAR PRODUCTS) ×1 IMPLANT
GLIDESHEATH SLEND A-KIT 6F 22G (SHEATH) ×1 IMPLANT
GUIDEWIRE INQWIRE 1.5J.035X260 (WIRE) IMPLANT
INQWIRE 1.5J .035X260CM (WIRE) ×2
KIT ENCORE 26 ADVANTAGE (KITS) ×1 IMPLANT
KIT HEART LEFT (KITS) ×2 IMPLANT
PACK CARDIAC CATHETERIZATION (CUSTOM PROCEDURE TRAY) ×2 IMPLANT
STENT RESOLUTE ONYX 4.0X30 (Permanent Stent) ×1 IMPLANT
TRANSDUCER W/STOPCOCK (MISCELLANEOUS) ×2 IMPLANT
TUBING CIL FLEX 10 FLL-RA (TUBING) ×3 IMPLANT
WIRE COUGAR XT STRL 190CM (WIRE) ×1 IMPLANT

## 2019-01-01 NOTE — ED Triage Notes (Signed)
Pt is c/o chest pain that started today around 5pm while he was working in his garage  Pt states he has some nausea with the pain

## 2019-01-01 NOTE — ED Notes (Signed)
EDP at bedside STEMI called at this time.

## 2019-01-01 NOTE — ED Notes (Signed)
[facility] notified Selena Batten) - Code STEMI

## 2019-01-01 NOTE — H&P (Signed)
CARDIOLOGY ADMIT NOTE   Tyler [name] is an 48 y.o. male.    Chief Complaint  Patient presents with  . Chest Pain      HPI: Tyler [name]  is a 48 y.o. male  with history of DVT of right leg in 2017, spontaneous, bronchial asthma and dyspnea, tobacco use disorder, started having chest discomfort in the form of tightness associated with marked diaphoresis and nausea around 530 this evening.  Presented to the emergency room around 8:30 PM on 01/01/2019, EKG revealed ST elevation in the anterolateral leads.  STEMI was activated.  Patient was emergently transferred to [facility] cardiac catheterization lab.  On presentation to the emergency room patient continued to have chest discomfort but much improved on heparin and nitroglycerin.  On presentation to the cardiac catheterization lab his chest pain was mild at around 2-3.  Past Medical History:  Diagnosis Date  . Allergic rhinitis   . Chronic asthma   . COPD (chronic obstructive pulmonary disease) (HCC)   . DVT (deep venous thrombosis) (HCC) 2004   with pulmonary embolism after knee surgery    Past Surgical History:  Procedure Laterality Date  . KNEE SURGERY     acl  . TENDON REPAIR     rt wrist    Social History   Socioeconomic History  . Marital status: Single    Spouse name: Not on file  . Number of children: Not on file  . Years of education: Not on file  . Highest education level: Not on file  Occupational History  . Occupation: heat and Engineer, materialsair conditioning repair  Social Needs  . Financial resource strain: Not on file  . Food insecurity:    Worry: Not on file    Inability: Not on file  . Transportation needs:    Medical: Not on file    Non-medical: Not on file  Tobacco Use  . Smoking status: Current Every Day Smoker    Packs/day: 0.10    Years: 30.00    Pack years: 3.00    Types: Cigarettes  . Smokeless tobacco: Never Used  . Tobacco comment: 1-2 per day  Substance and Sexual Activity  .  Alcohol use: Yes    Alcohol/week: 0.0 standard drinks  . Drug use: Never  . Sexual activity: Not on file  Lifestyle  . Physical activity:    Days per week: Not on file    Minutes per session: Not on file  . Stress: Not on file  Relationships  . Social connections:    Talks on phone: Not on file    Gets together: Not on file    Attends religious service: Not on file    Active member of club or organization: Not on file    Attends meetings of clubs or organizations: Not on file    Relationship status: Not on file  . Intimate partner violence:    Fear of current or ex partner: Not on file    Emotionally abused: Not on file    Physically abused: Not on file    Forced sexual activity: Not on file  Other Topics Concern  . Not on file  Social History Narrative  . Not on file    No current facility-administered medications on file prior to encounter.    Current Outpatient Medications on File Prior to Encounter  Medication Sig Dispense Refill  . albuterol (PROVENTIL HFA;VENTOLIN HFA) 108 (90 Base) MCG/ACT inhaler Inhale 2 puffs into the lungs every 6 (six)  hours as needed for wheezing or shortness of breath. 1 Inhaler prn  . albuterol (PROVENTIL) (2.5 MG/3ML) 0.083% nebulizer solution Take 3 mLs (2.5 mg total) by nebulization every 6 (six) hours as needed for wheezing or shortness of breath. Use as directed 75 mL prn  . Azelastine-Fluticasone (DYMISTA) 137-50 MCG/ACT SUSP 1-2 puffs each nostril once daily at bedtime 1 Bottle prn  . EPINEPHrine (EPIPEN 2-PAK) 0.3 mg/0.3 mL DEVI Inject 0.3 mg into the muscle once.      . Fluticasone-Umeclidin-Vilant (TRELEGY ELLIPTA) 100-62.5-25 MCG/INH AEPB Inhale 1 puff into the lungs daily. 60 each 12  . Fluticasone-Umeclidin-Vilant (TRELEGY ELLIPTA) 100-62.5-25 MCG/INH AEPB Inhale 1 puff into the lungs daily. 60 each 1   Review of Systems  Constitution: Negative for chills, decreased appetite, malaise/fatigue and weight gain.  Cardiovascular:  Positive for chest pain and dyspnea on exertion. Negative for leg swelling and syncope.  Endocrine: Negative for cold intolerance.  Hematologic/Lymphatic: Does not bruise/bleed easily.  Musculoskeletal: Negative for joint swelling.  Gastrointestinal: Negative for abdominal pain, anorexia, change in bowel habit, hematochezia and melena.  Neurological: Negative for headaches and light-headedness.  Psychiatric/Behavioral: Negative for depression and substance abuse.  All other systems reviewed and are negative.      Objective:  Blood pressure (!) 133/98, pulse 94, temperature 97.9 F (36.6 C), temperature source Oral, resp. rate 18, height 6\' 3"  (1.905 m), weight 117.9 kg, SpO2 100 %. Body mass index is 32.5 kg/m.  Physical Exam  Constitutional: He appears well-developed. No distress.  Mildly obese  HENT:  Head: Atraumatic.  Eyes: Conjunctivae are normal.  Neck: Neck supple. No JVD present. No thyromegaly present.  Cardiovascular: Normal rate, regular rhythm, normal heart sounds and intact distal pulses. Exam reveals no gallop.  No murmur heard. Pulmonary/Chest: Effort normal and breath sounds normal.  Abdominal: Soft. Bowel sounds are normal.  Musculoskeletal: Normal range of motion.  Neurological: He is alert.  Skin: Skin is warm and dry.  Psychiatric: He has a normal mood and affect.    Radiology: Dg Chest Portable 1 View  Result Date: 01/01/2019 CLINICAL DATA:  Acute chest pain for 3 hours. EXAM: PORTABLE CHEST 1 VIEW COMPARISON:  09/03/2017 FINDINGS: The heart size and mediastinal contours are within normal limits. Both lungs are clear. Patient is rotated partially to the left. IMPRESSION: No active disease. Electronically Signed   By: Myles Rosenthal M.D.   On: 01/01/2019 22:08   Laboratory Examination:  CMP Latest Ref Rng & Units 01/01/2019  Glucose 70 - 99 mg/dL 867(Y)  BUN 6 - 20 mg/dL 14  Creatinine 1.95 - 0.93 mg/dL 2.67  Sodium 124 - 580 mmol/L 138  Potassium 3.5 -  5.1 mmol/L 4.0  Chloride 98 - 111 mmol/L 100  CO2 22 - 32 mmol/L 24  Calcium 8.9 - 10.3 mg/dL 99.8  Total Protein 6.5 - 8.1 g/dL 3.3(A)  Total Bilirubin 0.3 - 1.2 mg/dL 0.5  Alkaline Phos 38 - 126 U/L 71  AST 15 - 41 U/L 45(H)  ALT 0 - 44 U/L 39   CBC Latest Ref Rng & Units 01/01/2019 09/03/2017  WBC 4.0 - 10.5 K/uL 14.5(H) 9.9  Hemoglobin 13.0 - 17.0 g/dL 25.0 53.9  Hematocrit 76.7 - 52.0 % 47.8 46.7  Platelets 150 - 400 K/uL 333 376.0   Lipid Panel     Component Value Date/Time   CHOL PENDING 01/01/2019 2137   TRIG PENDING 01/01/2019 2137   HDL PENDING 01/01/2019 2137   CHOLHDL PENDING 01/01/2019 2137  VLDL PENDING 01/01/2019 2137   LDLCALC NOT CALCULATED 01/01/2019 2137   HEMOGLOBIN A1C No results found for: HGBA1C, MPG TSH No results for input(s): TSH in the last 8760 hours. Cardiac Panel (last 3 results) Recent Labs    01/01/19 2137  TROPONINI 0.94*     Cardiac studies:   None   Assessment:    1.  Acute anterolateral myocardial infarction EKG 01/01/2019: Normal sinus rhythm at rate of 92 bpm, ST elevation in anterolateral leads with reciprocal ST depression in the inferior leads.  Acute injury pattern. 2.  History of bronchial asthma 3.  History of spontaneous DVT in 2017.  Plan:  Patient with ongoing chest discomfort with ST elevation in the anterolateral leads, will proceed emergently to the cardiac catheterization lab.  Physical examination does not reveal any evidence of pulmonary edema or cardiogenic shock.  Discussed with the patient, he is willing to proceed.  Yates Decamp, MD, [facility] 01/01/2019, 11:37 PM [facility] [place] Pager: [phone] Office: [phone] If no answer Cell [phone]

## 2019-01-01 NOTE — ED Provider Notes (Signed)
[facility] EMERGENCY DEPARTMENT Provider Note   CSN: [id number] Arrival date & time: 01/01/19  2057    History   Chief Complaint Chief Complaint  Patient presents with  . Chest Pain    HPI Tyler [name] is a 48 y.o. male.     The history is provided by the patient.  Chest Pain  Pain location:  Substernal area Pain quality: aching and pressure   Pain radiates to:  Does not radiate Pain severity:  Moderate Onset quality:  Gradual Duration:  3 hours Timing:  Constant Progression:  Worsening Chronicity:  New Context: at rest   Relieved by:  Nothing Worsened by:  Nothing Associated symptoms: nausea   Associated symptoms: no abdominal pain, no back pain, no cough, no fever, no palpitations, no shortness of breath and no vomiting   Risk factors: male sex, obesity and smoking   Risk factors: no diabetes mellitus, no high cholesterol and no hypertension     Past Medical History:  Diagnosis Date  . Allergic rhinitis   . Chronic asthma   . COPD (chronic obstructive pulmonary disease) (HCC)   . DVT (deep venous thrombosis) (HCC) 2004   with pulmonary embolism after knee surgery    Patient Active Problem List   Diagnosis Date Noted  . Rash and nonspecific skin eruption 09/03/2016  . Acute maxillary sinusitis 09/16/2014  . TOBACCO ABUSE 08/10/2007  . Thromboembolism of deep veins of lower extremity- hx 08/10/2007  . Seasonal and perennial allergic rhinitis 08/10/2007  . Asthma with COPD (chronic obstructive pulmonary disease) (HCC) 08/10/2007    Past Surgical History:  Procedure Laterality Date  . KNEE SURGERY     acl  . TENDON REPAIR     rt wrist        Home Medications    Prior to Admission medications   Medication Sig Start Date End Date Taking? Authorizing Provider  albuterol (PROVENTIL HFA;VENTOLIN HFA) 108 (90 Base) MCG/ACT inhaler Inhale 2 puffs into the lungs every 6 (six) hours as needed for wheezing or shortness of breath.  09/03/18   Jetty Duhamel D, MD  albuterol (PROVENTIL) (2.5 MG/3ML) 0.083% nebulizer solution Take 3 mLs (2.5 mg total) by nebulization every 6 (six) hours as needed for wheezing or shortness of breath. Use as directed 08/16/13   Jetty Duhamel D, MD  Azelastine-Fluticasone [facility]) [phone] MCG/ACT SUSP 1-2 puffs each nostril once daily at bedtime 08/31/15   Jetty Duhamel D, MD  EPINEPHrine (EPIPEN 2-PAK) 0.3 mg/0.3 mL DEVI Inject 0.3 mg into the muscle once.      [provider]  Fluticasone-Umeclidin-Vilant (TRELEGY ELLIPTA) 100-62.5-25 MCG/INH AEPB Inhale 1 puff into the lungs daily. 09/03/18   Jetty Duhamel D, MD  Fluticasone-Umeclidin-Vilant (TRELEGY ELLIPTA) 100-62.5-25 MCG/INH AEPB Inhale 1 puff into the lungs daily. 09/03/18   Waymon Budge, MD    Family History Family History  Problem Relation Age of Onset  . Diabetes Mother     Social History Social History   Tobacco Use  . Smoking status: Current Every Day Smoker    Packs/day: 0.10    Years: 30.00    Pack years: 3.00    Types: Cigarettes  . Smokeless tobacco: Never Used  . Tobacco comment: 1-2 per day  Substance Use Topics  . Alcohol use: Yes    Alcohol/week: 0.0 standard drinks  . Drug use: Never     Allergies   Patient has no known allergies.   Review of Systems Review of Systems  Constitutional:  Negative for chills and fever.  HENT: Negative for ear pain and sore throat.   Eyes: Negative for pain and visual disturbance.  Respiratory: Negative for cough and shortness of breath.   Cardiovascular: Positive for chest pain. Negative for palpitations.  Gastrointestinal: Positive for nausea. Negative for abdominal pain and vomiting.  Genitourinary: Negative for dysuria and hematuria.  Musculoskeletal: Negative for arthralgias and back pain.  Skin: Negative for color change and rash.  Neurological: Negative for seizures and syncope.  All other systems reviewed and are negative.    Physical Exam  Updated Vital Signs BP (!) 136/98   Pulse 90   Temp 97.9 F (36.6 C) (Oral)   Resp 15   Ht 6\' 3"eztfVsLbEZE$  (1.905 m)   Wt 117.9 kg   SpO2 100%   BMI 32.50 kg/m   Physical Exam Vitals signs and nursing note reviewed.  Constitutional:      General: He is in acute distress.     Appearance: He is well-developed. He is obese. He is ill-appearing.  HENT:     Head: Normocephalic and atraumatic.  Eyes:     Extraocular Movements: Extraocular movements intact.     Conjunctiva/sclera: Conjunctivae normal.     Pupils: Pupils are equal, round, and reactive to light.  Neck:     Musculoskeletal: Neck supple.  Cardiovascular:     Rate and Rhythm: Normal rate and regular rhythm.     Pulses:          Radial pulses are 2+ on the right side and 2+ on the left side.     Heart sounds: No murmur.  Pulmonary:     Effort: Pulmonary effort is normal. No respiratory distress.     Breath sounds: Normal breath sounds. No decreased breath sounds or wheezing.  Abdominal:     Palpations: Abdomen is soft.     Tenderness: There is no abdominal tenderness.  Musculoskeletal: Normal range of motion.     Right lower leg: No edema.     Left lower leg: No edema.  Skin:    General: Skin is warm and dry.     Capillary Refill: Capillary refill takes less than 2 seconds.  Neurological:     General: No focal deficit present.     Mental Status: He is alert.      ED Treatments / Results  Labs (all labs ordered are listed, but only abnormal results are displayed) Labs Reviewed  SARS CORONAVIRUS 2 (HOSP ORDER, PERFORMED IN [place] [facility] VIA ABBOTT ID)  CBC WITH DIFFERENTIAL/PLATELET  PROTIME-INR  APTT  COMPREHENSIVE METABOLIC PANEL  TROPONIN I  LIPID PANEL    EKG EKG Interpretation  Date/Time:  Friday Jan 01 2019 21:04:30 EDT Ventricular Rate:  92 PR Interval:  170 QRS Duration: 86 QT Interval:  370 QTC Calculation: 457 R Axis:   87 Text Interpretation:  STEMI Normal sinus rhythm Anteroseptal infarct  , possibly acute Lateral injury pattern Abnormal ECG Reconfirmed by Virgina NorfolkAdam, Kamaria Lucia [phone]) on 01/01/2019 9:43:45 PM   Radiology No results found.  Procedures .Critical Care Performed by: Virgina Norfolkuratolo, Lavada Langsam, DO Authorized by: Virgina Norfolkuratolo, Sincerity Cedar, DO   Critical care provider statement:    Critical care time (minutes):  35   Critical care was necessary to treat or prevent imminent or life-threatening deterioration of the following conditions:  Cardiac failure   Critical care was time spent personally by me on the following activities:  Blood draw for specimens, development of treatment plan with patient or surrogate, discussions with  primary provider, evaluation of patient's response to treatment, examination of patient, obtaining history from patient or surrogate, ordering and performing treatments and interventions, ordering and review of laboratory studies, pulse oximetry, ordering and review of radiographic studies, review of old charts and re-evaluation of patient's condition   I assumed direction of critical care for this patient from another provider in my specialty: no     (including critical care time)  Medications Ordered in ED Medications  0.9 %  sodium chloride infusion (has no administration in time range)  nitroGLYCERIN (NITROSTAT) SL tablet 0.4 mg (0.4 mg Sublingual Given 01/01/19 2124)  ondansetron (ZOFRAN) 4 MG/2ML injection (has no administration in time range)  aspirin chewable tablet 324 mg (324 mg Oral Given 01/01/19 2116)  heparin injection 4,000 Units (4,000 Units Intravenous Given 01/01/19 2120)  heparin 5000 UNIT/ML injection (  Given 01/01/19 2143)  ondansetron (ZOFRAN) injection 4 mg (4 mg Intravenous Given 01/01/19 2136)     Initial Impression / Assessment and Plan / ED Course  I have reviewed the triage vital signs and the nursing notes.  Pertinent labs & imaging results that were available during my care of the patient were reviewed by me and considered in my medical  decision making (see chart for details).     Tyler [name] is a 48 year old male who presents to the ED with chest pain.  History of smoking.  Unremarkable vitals.  No fever.  EKG shows ST elevation in the anterior leads with depressions in the inferior leads.  Code STEMI was activated.  Talked with Dr. Eldridge Dace at [facility] who accepts the patient in transfer for emergent catheterization.  Patient was given aspirin, nitroglycerin, IV heparin, Zofran.  Remained hemodynamically stable throughout my care.  Had improvement of pain with nitroglycerin.  Lab work was collected and patient was transferred to [facility] for emergent catheterization. Lab work pending at time of transfer.  This chart was dictated using voice recognition software.  Despite best efforts to proofread,  errors can occur which can change the documentation meaning.    Final Clinical Impressions(s) / ED Diagnoses   Final diagnoses:  ST elevation myocardial infarction (STEMI), unspecified artery [facility])    ED Discharge Orders    None       Virgina Norfolk, DO 01/01/19 2144

## 2019-01-01 NOTE — Progress Notes (Signed)
ANTICOAGULATION CONSULT NOTE - Initial Consult  Pharmacy Consult for Heparin Indication: chest pain/ACS  No Known Allergies  Patient Measurements: Height: 6\' 3"  (190.5 cm) Weight: 260 lb (117.9 kg) IBW/kg (Calculated) : 84.5 Heparin Dosing Weight: 109.3  Vital Signs: Temp: 97.9 F (36.6 C) (05/29 2102) Temp Source: Oral (05/29 2102) BP: 151/99 (05/29 2120) Pulse Rate: 98 (05/29 2115)  Labs: No results for input(s): HGB, HCT, PLT, APTT, LABPROT, INR, HEPARINUNFRC, HEPRLOWMOCWT, CREATININE, CKTOTAL, CKMB, TROPONINI in the last 72 hours.  CrCl cannot be calculated (No successful lab value found.).   Medical History: Past Medical History:  Diagnosis Date  . Allergic rhinitis   . Chronic asthma   . COPD (chronic obstructive pulmonary disease) (HCC)   . DVT (deep venous thrombosis) (HCC) 2004   with pulmonary embolism after knee surgery   Assessment: 48 yo male admitted with chest pain. Pharmacy consulted to start heparin for chest pain/ACS.  Patient was not on any anticoagulation prior to admission  Goal of Therapy:  Heparin level 0.3-0.7 units/ml Monitor platelets by anticoagulation protocol: Yes   Plan:  Already given a bolus dose of heparin 4000 units IV x 1 Start heparin infusion at 1300 units/hr Check anti-Xa level in 6-8 hours and daily while on heparin Continue to monitor H&H and platelets  Jeanella Cara, PharmD, [facility] Clinical Pharmacist Please see AMION for all Pharmacists' Contact Phone Numbers 01/01/2019, 9:44 PM

## 2019-01-02 ENCOUNTER — Inpatient Hospital Stay (HOSPITAL_COMMUNITY): Payer: 59

## 2019-01-02 DIAGNOSIS — E7800 Pure hypercholesterolemia, unspecified: Secondary | ICD-10-CM

## 2019-01-02 DIAGNOSIS — Z720 Tobacco use: Secondary | ICD-10-CM

## 2019-01-02 DIAGNOSIS — R739 Hyperglycemia, unspecified: Secondary | ICD-10-CM

## 2019-01-02 DIAGNOSIS — I472 Ventricular tachycardia: Secondary | ICD-10-CM

## 2019-01-02 DIAGNOSIS — I2109 ST elevation (STEMI) myocardial infarction involving other coronary artery of anterior wall: Principal | ICD-10-CM

## 2019-01-02 LAB — BASIC METABOLIC PANEL
Anion gap: 18 — ABNORMAL HIGH (ref 5–15)
BUN: 9 mg/dL (ref 6–20)
CO2: 23 mmol/L (ref 22–32)
Calcium: 9.7 mg/dL (ref 8.9–10.3)
Chloride: 98 mmol/L (ref 98–111)
Creatinine, Ser: 1.05 mg/dL (ref 0.61–1.24)
GFR calc Af Amer: >60 mL/min (ref >60)
GFR calc non Af Amer: >60 mL/min (ref >60)
Glucose, Bld: 152 mg/dL — ABNORMAL HIGH (ref 70–99)
Potassium: 3.8 mmol/L (ref 3.5–5.1)
Sodium: 139 mmol/L (ref 135–145)

## 2019-01-02 LAB — LIPID PANEL
Cholesterol: 266 mg/dL — ABNORMAL HIGH (ref 0–200)
HDL: 45 mg/dL (ref >40)
Total CHOL/HDL Ratio: 5.9 RATIO
Triglycerides: 110 mg/dL (ref <150)
VLDL: 22 mg/dL (ref 0–40)

## 2019-01-02 LAB — CBC
HCT: 43 % (ref 39.0–52.0)
Hemoglobin: 14.4 g/dL (ref 13.0–17.0)
MCH: 29.6 pg (ref 26.0–34.0)
MCHC: 33.5 g/dL (ref 30.0–36.0)
MCV: 88.3 fL (ref 80.0–100.0)
Platelets: 318 10*3/uL (ref 150–400)
RBC: 4.87 MIL/uL (ref 4.22–5.81)
RDW: 13.9 % (ref 11.5–15.5)
WBC: 14.1 10*3/uL — ABNORMAL HIGH (ref 4.0–10.5)
nRBC: 0 % (ref 0.0–0.2)

## 2019-01-02 LAB — TROPONIN I
Troponin I: 30.46 ng/mL (ref <0.03)
Troponin I: >65 ng/mL (ref <0.03)
Troponin I: >65 ng/mL (ref <0.03)

## 2019-01-02 LAB — MRSA PCR SCREENING: MRSA by PCR: NEGATIVE

## 2019-01-02 LAB — LDL CHOLESTEROL, DIRECT: Direct LDL: 156.5 mg/dL — ABNORMAL HIGH (ref 0–99)

## 2019-01-02 LAB — TSH: TSH: 0.737 u[IU]/mL (ref 0.350–4.500)

## 2019-01-02 MED ORDER — SODIUM CHLORIDE 0.9 % IV SOLN: 250.0000 mL | INTRAVENOUS | Status: DC | PRN

## 2019-01-02 MED ORDER — CARVEDILOL 12.5 MG PO TABS
12.5000 mg | ORAL_TABLET | Freq: Two times a day (BID) | ORAL | Status: DC
  Administered 2019-01-02 – 2019-01-03 (×2): 12.5 mg via ORAL
  Filled 2019-01-02 (×2): qty 1

## 2019-01-02 MED ORDER — SODIUM CHLORIDE 0.9 % IV SOLN
0.7500 ug/kg/min | INTRAVENOUS | Status: AC
  Administered 2019-01-02: 0.75 ug/kg/min via INTRAVENOUS
  Filled 2019-01-02: qty 50

## 2019-01-02 MED ORDER — SODIUM CHLORIDE 0.9% FLUSH
3.0000 mL | Freq: Two times a day (BID) | INTRAVENOUS | Status: DC
  Administered 2019-01-02 – 2019-01-03 (×2): 3 mL via INTRAVENOUS

## 2019-01-02 MED ORDER — CHLORHEXIDINE GLUCONATE CLOTH 2 % EX PADS: 6.0000 | MEDICATED_PAD | Freq: Every day | CUTANEOUS | Status: DC

## 2019-01-02 MED ORDER — RIVAROXABAN 10 MG PO TABS: 10.0000 mg | ORAL_TABLET | Freq: Every day | ORAL | Status: DC

## 2019-01-02 MED ORDER — LABETALOL HCL 5 MG/ML IV SOLN: 10.0000 mg | INTRAVENOUS | Status: AC | PRN

## 2019-01-02 MED ORDER — UMECLIDINIUM BROMIDE 62.5 MCG/INH IN AEPB
1.0000 | INHALATION_SPRAY | Freq: Every day | RESPIRATORY_TRACT | Status: DC
  Administered 2019-01-03: 1 via RESPIRATORY_TRACT
  Filled 2019-01-02: qty 7

## 2019-01-02 MED ORDER — SODIUM CHLORIDE 0.9 % WEIGHT BASED INFUSION
1.0000 mL/kg/h | INTRAVENOUS | Status: AC
  Administered 2019-01-02 (×2): 1 mL/kg/h via INTRAVENOUS

## 2019-01-02 MED ORDER — ALBUTEROL SULFATE (2.5 MG/3ML) 0.083% IN NEBU: 2.5000 mg | INHALATION_SOLUTION | Freq: Four times a day (QID) | RESPIRATORY_TRACT | Status: DC | PRN

## 2019-01-02 MED ORDER — FLUTICASONE FUROATE-VILANTEROL 100-25 MCG/INH IN AEPB
1.0000 | INHALATION_SPRAY | Freq: Every day | RESPIRATORY_TRACT | Status: DC
  Administered 2019-01-03: 1 via RESPIRATORY_TRACT
  Filled 2019-01-02: qty 28

## 2019-01-02 MED ORDER — SODIUM CHLORIDE 0.9% FLUSH: 3.0000 mL | INTRAVENOUS | Status: DC | PRN

## 2019-01-02 MED ORDER — DIAZEPAM 5 MG PO TABS
10.0000 mg | ORAL_TABLET | ORAL | Status: DC | PRN
  Administered 2019-01-02 – 2019-01-03 (×4): 10 mg via ORAL
  Filled 2019-01-02 (×4): qty 2

## 2019-01-02 MED ORDER — RIVAROXABAN 10 MG PO TABS
10.0000 mg | ORAL_TABLET | Freq: Every day | ORAL | Status: DC
  Administered 2019-01-02 (×2): 10 mg via ORAL
  Filled 2019-01-02 (×2): qty 1

## 2019-01-02 NOTE — Progress Notes (Signed)
  Echocardiogram 2D Echocardiogram has been performed.  Tyler [name] 01/02/2019, 10:11 AM

## 2019-01-02 NOTE — Progress Notes (Signed)
Subjective:  Feels well, no chest pain.  Intake/Output from previous day:  I/O last 3 completed shifts: In: 1038.9 [I.V.:1038.9] Out: 425 [Urine:425] Total I/O In: 697.9 [P.O.:580; I.V.:117.9] Out: 600 [Urine:600]  Blood pressure (!) 118/58, pulse 92, temperature 99.1 F (37.3 C), temperature source Oral, resp. rate 15, height _0  (1.905 m), weight 117.9 kg, SpO2 96 %. Physical Exam  Constitutional: He appears well-developed. No distress.  Mildly obese  HENT:  Head: Atraumatic.  Eyes: Conjunctivae are normal.  Neck: Neck supple. No JVD present. No thyromegaly present.  Cardiovascular: Normal rate, regular rhythm, normal heart sounds and intact distal pulses. Exam reveals no gallop.  No murmur heard. Pulmonary/Chest: Effort normal and breath sounds normal.  Abdominal: Soft. Bowel sounds are normal.  Musculoskeletal: Normal range of motion.  Neurological: He is alert.  Skin: Skin is warm and dry.  Psychiatric: He has a normal mood and affect.  Right radial arterial access site without any complications.  Lab Results: BMP BNP (last 3 results) No results for input(s): BNP in the last 8760 hours.  ProBNP (last 3 results) No results for input(s): PROBNP in the last 8760 hours. BMP Latest Ref Rng & Units 01/02/2019 01/01/2019 01/01/2019  Glucose 70 - 99 mg/dL 152(H) - -  BUN 6 - 20 mg/dL 9 - -  Creatinine 0.61 - 1.24 mg/dL 1.05 - 0.90  Sodium 135 - 145 mmol/L 139 139 -  Potassium 3.5 - 5.1 mmol/L 3.8 4.1 -  Chloride 98 - 111 mmol/L 98 - -  CO2 22 - 32 mmol/L 23 - -  Calcium 8.9 - 10.3 mg/dL 9.7 - -   Hepatic Function Latest Ref Rng & Units 01/01/2019  Total Protein 6.5 - 8.1 g/dL 8.7(H)  Albumin 3.5 - 5.0 g/dL 4.7  AST 15 - 41 U/L 45(H)  ALT 0 - 44 U/L 39  Alk Phosphatase 38 - 126 U/L 71  Total Bilirubin 0.3 - 1.2 mg/dL 0.5   CBC Latest Ref Rng & Units 01/02/2019 01/01/2019 01/01/2019  WBC 4.0 - 10.5 K/uL 14.1(H) - 14.5(H)  Hemoglobin 13.0 - 17.0 g/dL 14.4 15.3 15.6   Hematocrit 39.0 - 52.0 % 43.0 45.0 47.8  Platelets 150 - 400 K/uL 318 - 333   Lipid Panel     Component Value Date/Time   CHOL 266 (H) 01/01/2019 2137   TRIG 110 01/01/2019 2137   HDL 45 01/01/2019 2137   CHOLHDL 5.9 01/01/2019 2137   VLDL 22 01/01/2019 2137   LDLCALC NOT CALCULATED 01/01/2019 2137   Cardiac Panel (last 3 results) Recent Labs    01/01/19 2137 01/02/19 0149 01/02/19 0750  TROPONINI 0.94* 30.46* >65.00*   Imaging: Dg Chest Portable 1 View  Result Date: 01/01/2019 CLINICAL DATA:  Acute chest pain for 3 hours. EXAM: PORTABLE CHEST 1 VIEW COMPARISON:  09/03/2017 FINDINGS: The heart size and mediastinal contours are within normal limits. Both lungs are clear. Patient is rotated partially to the left. IMPRESSION: No active disease. Electronically Signed   By: Earle Gell M.D.   On: 01/01/2019 22:08    Cardiac Studies:  EKG: 01/02/2019: Normal sinus rhythm, normal axis, anteroseptal infarct old.  High lateral infart, old.  Cannot exclude high lateral ischemia.  Compared to 01/01/2019, acute anterolateral injury pattern no longer present.   Echocardiogram 01/02/2019 :   1. The left ventricle has a visually estimated ejection fraction of 35%. The cavity size was normal. There is mildly increased left ventricular wall thickness. Left ventricular diastolic parameters were normal. Entire  Anterior and inferoseptal akinesis, apical akinesis and anterolateral akinesis  Scheduled Meds: . aspirin EC  81 mg Oral Daily  . atorvastatin  80 mg Oral q1800  . carvedilol  6.25 mg Oral BID WC  . Chlorhexidine Gluconate Cloth  6 each Topical Daily  . fluticasone furoate-vilanterol  1 puff Inhalation Daily  . rivaroxaban  10 mg Oral Q supper  . sodium chloride flush  3 mL Intravenous Q12H  . ticagrelor  90 mg Oral BID  . umeclidinium bromide  1 puff Inhalation Daily   Continuous Infusions: . sodium chloride     PRN Meds:.sodium chloride, acetaminophen, diazepam, nitroGLYCERIN,  ondansetron (ZOFRAN) IV, sodium chloride flush  Assessment/Plan:  1. Acute anterolateral STEMI S/P Primary PCI/ thrombectomy and stenting to Ostial LAD with 4x30 mm Onyx DES. Thrombectomy to prox Cx. 2. NSVT - 3 beat asymptomatic on telemetry. 3. Ischemic cardiomyopathy with moderate LV systolic dysfunction. 4. Tobacco use disorder. 5. Hyperchol  Rec: Continue DAPT and also Xarelto 10 mg daily. Continuie Coreg, increase to 12.5 mg BID in view of NSVT. Observe in the telemetry for 24 hours for further VT and if no further VT, no need for Lifevest. Expect improvement in LVEF over time. Will start Entresto or ARB as BP allows.  Smoking cessation discussed.  Adrian Prows, M.D. 01/02/2019, 11:51 AM [facility] Pager: [phone] Office: [phone] If no answer: [phone]

## 2019-01-02 NOTE — Progress Notes (Signed)
CARDIAC REHAB PHASE I   PRE:  Rate/Rhythm: 88 SR  BP:  Supine: 129/88  Sitting:   Standing:    SaO2: 96%RA  MODE:  Ambulation: 780 ft   POST:  Rate/Rhythm: 96 SR  BP:  Supine:   Sitting: 118/58  Standing:    SaO2: 96%RA 1018-1118 Pt walked 780 ft on RA with steady gait and no CP. Tolerated well. Stressed importance of brilinta with stent. Reviewed NTG use, MI restrictions, ex ed, smoking cessation, heart healthy and low sodium diets, daily weights, CRP 2. Gave pt CHF booklet due to low EF and reviewed signs/symptoms of when to call MD. Pt has scales at home. Discussed 2000 mg sodium restrictions. Gave smoking cessation and encouraged to call [phone] if needed. Pt stated he plans to quit cold [place]. He has quit in the past. Discussed CRP 2 and referring to [facility] 2.   Luetta Nutting, RN BSN  01/02/2019 11:13 AM

## 2019-01-03 LAB — ECHOCARDIOGRAM COMPLETE
Height: 75 in
Weight: 4160 oz

## 2019-01-03 LAB — HEMOGLOBIN A1C
Hgb A1c MFr Bld: 5.5 % (ref 4.8–5.6)
Mean Plasma Glucose: 111.15 mg/dL

## 2019-01-03 LAB — HIV ANTIBODY (ROUTINE TESTING W REFLEX): HIV Screen 4th Generation wRfx: NONREACTIVE

## 2019-01-03 MED ORDER — ATORVASTATIN CALCIUM 80 MG PO TABS: 80.0000 mg | ORAL_TABLET | Freq: Every day | ORAL | 2 refills | Status: DC

## 2019-01-03 MED ORDER — CARVEDILOL 12.5 MG PO TABS: 12.5000 mg | ORAL_TABLET | Freq: Two times a day (BID) | ORAL | 1 refills | Status: DC

## 2019-01-03 MED ORDER — RIVAROXABAN 10 MG PO TABS: 10.0000 mg | ORAL_TABLET | Freq: Every day | ORAL | 0 refills | Status: DC

## 2019-01-03 MED ORDER — NITROGLYCERIN 0.4 MG SL SUBL: 0.4000 mg | SUBLINGUAL_TABLET | SUBLINGUAL | 1 refills | Status: AC | PRN

## 2019-01-03 MED ORDER — ASPIRIN 81 MG PO TBEC: 81.0000 mg | DELAYED_RELEASE_TABLET | Freq: Every day | ORAL | Status: AC

## 2019-01-03 MED ORDER — TICAGRELOR 90 MG PO TABS: 90.0000 mg | ORAL_TABLET | Freq: Two times a day (BID) | ORAL | 0 refills | Status: DC

## 2019-01-03 NOTE — Care Management (Signed)
Patient given Brilinta and Xarelto cards for free 30 days and copay reduction.  Pt verbalizes understanding.

## 2019-01-03 NOTE — Discharge Instructions (Signed)
Do not drive until see by [place] in the office  Information on my medicine - XARELTO (Rivaroxaban)  Why was Xarelto prescribed for you? Xarelto was prescribed for you to reduce the risk of a blood clot forming.  What do you need to know about xarelto ? Take your Xarelto ONCE DAILY at the same time every day with your evening meal. If you have difficulty swallowing the tablet whole, you may crush it and mix in applesauce just prior to taking your dose.  Take Xarelto exactly as prescribed by your doctor and DO NOT stop taking Xarelto without talking to the doctor who prescribed the medication.  Stopping without other stroke prevention medication to take the place of Xarelto may increase your risk of developing a clot that causes a stroke.  Refill your prescription before you run out.  After discharge, you should have regular check-up appointments with your healthcare provider that is prescribing your Xarelto.  In the future your dose may need to be changed if your kidney function or weight changes by a significant amount.  What do you do if you miss a dose? If you are taking Xarelto ONCE DAILY and you miss a dose, take it as soon as you remember on the same day then continue your regularly scheduled once daily regimen the next day. Do not take two doses of Xarelto at the same time or on the same day.   Important Safety Information A possible side effect of Xarelto is bleeding. You should call your healthcare provider right away if you experience any of the following: ? Bleeding from an injury or your nose that does not stop. ? Unusual colored urine (red or dark brown) or unusual colored stools (red or black). ? Unusual bruising for unknown reasons. ? A serious fall or if you hit your head (even if there is no bleeding).  Some medicines may interact with Xarelto and might increase your risk of bleeding while on Xarelto. To help avoid this, consult your healthcare provider or  pharmacist prior to using any new prescription or non-prescription medications, including herbals, vitamins, non-steroidal anti-inflammatory drugs (NSAIDs) and supplements.  This website has more information on Xarelto: VisitDestination.com.br.

## 2019-01-03 NOTE — Progress Notes (Signed)
Patient discharged home with family after going over all discharge instructions.  All questions answered and voices understanding of discharge orders.

## 2019-01-03 NOTE — Discharge Summary (Signed)
Physician Discharge Summary  Patient ID: Tyler [name] MRN: [id number] DOB/AGE: 10/30/1970 48 y.o.  Admit date: 01/01/2019 Discharge date: 01/03/2019  Primary Discharge Diagnosis 1. Acute anterolateral STEMI S/P Primary PCI/ thrombectomy and stenting to Ostial LAD with 4x30 mm Onyx DES. Thrombectomy to prox Cx. 2. NSVT - 3 beat asymptomatic on telemetry. 3. Ischemic cardiomyopathy with moderate LV systolic dysfunction. 4. Tobacco use disorder. 5. Hypercholesterolemia 6.  Hyperglycemia  Significant Diagnostic Studies:  EKG: 01/02/2019: Normal sinus rhythm, normal axis, anteroseptal infarct old.  High lateral infart, old.  Cannot exclude high lateral ischemia.  Compared to 01/01/2019, acute anterolateral injury pattern no longer present.   Echocardiogram 01/02/2019 :   1. The left ventricle has a visually estimated ejection fraction of 35%. The cavity size was normal. There is mildly increased left ventricular wall thickness. Left ventricular diastolic parameters were normal. Entire Anterior and inferoseptal akinesis, apical akinesis and anterolateral   Hospital Course: Patient admitted to the hospital on emergent basis with ongoing chest pain and anterolateral ST elevation on the EKG, underwent primary angioplasty to the LAD and also aspiration thrombectomy to the circumflex coronary artery, patient was then transferred to the intensive care unit for observation, he did have brief episodes of NSVT, which resolved with increasing the beta-blocker dose with no recurrence of NSVT over the last 24 hours.  He has felt stable for discharge and remained asymptomatic.  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  I suspect he probably has hypercoagulable state.  Will do  hypercoagulable state follow-up and evaluation once he is off of Xarelto in 30 days.  He is aware to continue dual antiplatelet therapy as well.  I will initiate ACE inhibitor therapy or ARB in the outpatient basis.  Discharge Exam: Blood pressure 102/67, pulse 81, temperature 98.8 F (37.1 C), temperature source Oral, resp. rate 15, height 6\' 3"  (1.905 m), weight 117.9 kg, SpO2 95 %.   Physical Exam  Constitutional: He appears well-developed and well-nourished. No distress.  HENT:  Head: Atraumatic.  Eyes: Conjunctivae are normal.  Neck: Neck supple. No JVD present. No thyromegaly present.  Cardiovascular: Normal rate, regular rhythm, normal heart sounds and intact distal pulses. Exam reveals no gallop.  No murmur heard. Pulmonary/Chest: Effort normal and breath sounds normal.  Abdominal: Soft. Bowel sounds are normal.  Musculoskeletal: Normal range of motion.  Neurological: He is alert.  Skin: Skin is warm and dry.  Psychiatric: He has a normal mood and affect.    Labs:   Lab Results  Component Value Date   WBC 14.1 (H) 01/02/2019   HGB 14.4 01/02/2019   HCT 43.0 01/02/2019   MCV 88.3 01/02/2019   PLT 318 01/02/2019    Recent Labs  Lab 01/01/19 2137  01/02/19 0750  NA 138   < > 139  K 4.0   < > 3.8  CL 100  --  98  CO2 24  --  23  BUN 14  --  9  CREATININE 1.16   < > 1.05  CALCIUM 10.0  --  9.7  PROT 8.7*  --   --   BILITOT 0.5  --   --   ALKPHOS 71  --   --   ALT 39  --   --  AST 45*  --   --   GLUCOSE 139*  --  152*   < > = values in this interval not displayed.    Lipid Panel     Component Value Date/Time   CHOL 266 (H) 01/01/2019 2137   TRIG 110 01/01/2019 2137   HDL 45 01/01/2019 2137   CHOLHDL 5.9 01/01/2019 2137   VLDL 22 01/01/2019 2137   LDLCALC NOT CALCULATED 01/01/2019 2137   Direct LDL 156.  Cardiac Panel (last 3 results) Recent Labs    01/02/19 0149 01/02/19 0750 01/02/19 1239  TROPONINI 30.46* >65.00* >65.00*    Lab Results   Component Value Date   TROPONINI >65.00 (HH) 01/02/2019     TSH Recent Labs    01/02/19 1239  TSH 0.737   Radiology: Dg Chest Portable 1 View  Result Date: 01/01/2019 CLINICAL DATA:  Acute chest pain for 3 hours. EXAM: PORTABLE CHEST 1 VIEW COMPARISON:  09/03/2017 FINDINGS: The heart size and mediastinal contours are within normal limits. Both lungs are clear. Patient is rotated partially to the left. IMPRESSION: No active disease. Electronically Signed   By: Myles RosenthalJohn  Stahl M.D.   On: 01/01/2019 22:08    FOLLOW UP PLANS AND APPOINTMENTS Discharge Instructions    Amb Referral to Cardiac Rehabilitation   Complete by:  As directed    Referring to [facility] CRP 2   Diagnosis:   Coronary Stents STEMI     After initial evaluation and assessments completed: Virtual Based Care may be provided alone or in conjunction with Phase 2 Cardiac Rehab based on patient barriers.:  Yes     Allergies as of 01/03/2019   No Known Allergies     Medication List    TAKE these medications   albuterol 108 (90 Base) MCG/ACT inhaler Commonly known as:  VENTOLIN HFA Inhale 2 puffs into the lungs every 6 (six) hours as needed for wheezing or shortness of breath.   aspirin 81 MG EC tablet Take 1 tablet (81 mg total) by mouth daily. Start taking on:  January 04, 2019   atorvastatin 80 MG tablet Commonly known as:  LIPITOR Take 1 tablet (80 mg total) by mouth daily at 6 PM.   carvedilol 12.5 MG tablet Commonly known as:  COREG Take 1 tablet (12.5 mg total) by mouth 2 (two) times daily with a meal.   Fluticasone-Umeclidin-Vilant 100-62.5-25 MCG/INH Aepb Commonly known as:  Trelegy Ellipta Inhale 1 puff into the lungs daily.   nitroGLYCERIN 0.4 MG SL tablet Commonly known as:  NITROSTAT Place 1 tablet (0.4 mg total) under the tongue every 5 (five) minutes x 3 doses as needed for chest pain.   rivaroxaban 10 MG Tabs tablet Commonly known as:  XARELTO Take 1 tablet (10 mg total) by mouth daily with  supper.   ticagrelor 90 MG Tabs tablet Commonly known as:  BRILINTA Take 1 tablet (90 mg total) by mouth 2 (two) times daily.      Follow-up Information    Yates DecampGanji, Lyanna Blystone, MD. Call.   Specialty:  Cardiology Why:  To be seen in 10 days Contact information: [address] [place] [address] [phone]            Yates DecampJay Aura Bibby, MD 01/03/2019, 10:12 AM  Pager: [phone] Office: [phone] If no answer: [phone]

## 2019-01-04 ENCOUNTER — Encounter (HOSPITAL_COMMUNITY): Payer: Self-pay | Admitting: Cardiology

## 2019-01-04 NOTE — Progress Notes (Signed)
Please do TOC

## 2019-01-12 ENCOUNTER — Ambulatory Visit (INDEPENDENT_AMBULATORY_CARE_PROVIDER_SITE_OTHER): Payer: 59 | Admitting: Cardiology

## 2019-01-12 ENCOUNTER — Other Ambulatory Visit: Payer: Self-pay

## 2019-01-12 ENCOUNTER — Encounter: Payer: Self-pay | Admitting: Cardiology

## 2019-01-12 VITALS — BP 115/79 | HR 73 | Temp 98.2°F | Ht 75.0 in | Wt 262.0 lb

## 2019-01-12 DIAGNOSIS — Z86718 Personal history of other venous thrombosis and embolism: Secondary | ICD-10-CM | POA: Diagnosis not present

## 2019-01-12 DIAGNOSIS — I214 Non-ST elevation (NSTEMI) myocardial infarction: Secondary | ICD-10-CM | POA: Insufficient documentation

## 2019-01-12 DIAGNOSIS — I255 Ischemic cardiomyopathy: Secondary | ICD-10-CM

## 2019-01-12 DIAGNOSIS — I2510 Atherosclerotic heart disease of native coronary artery without angina pectoris: Secondary | ICD-10-CM | POA: Diagnosis not present

## 2019-01-12 DIAGNOSIS — F17200 Nicotine dependence, unspecified, uncomplicated: Secondary | ICD-10-CM

## 2019-01-12 MED ORDER — LISINOPRIL 10 MG PO TABS: 10.0000 mg | ORAL_TABLET | Freq: Every day | ORAL | 3 refills | Status: DC

## 2019-01-12 NOTE — Progress Notes (Signed)
Primary Physician:  Nila NephewGreen, Edwin, MD   Patient ID: Tyler ChrisStephen Eric Olliff, male    DOB: 04/02/1971, 48 y.o.   MRN: [id number]  Subjective:    Chief Complaint  Patient presents with  . NSTEMI    post hospital f/u  . New Patient (Initial Visit)    HPI: Tyler [name]  is a 48 y.o. male  with  history of DVT of right leg in 2017, spontaneous, bronchial asthma and dyspnea, tobacco use disorder, presented on 01/01/19 with anterolateral STEMI, in which he underwent urgent cath S/P Primary PCI/ thrombectomy and stenting to Ostial LAD with 4x30 mm Onyx DES. Thrombectomy to prox Cx. Noted to have ischemic cardiomyopathy with moderate LV dysfunction at 35%. Now presents for post-hospital follow up.  He did have 3 beats of NSVT on telemetry that were asymptomatic. Patient has suspected hypercoagulable state in view of now thrombus in ostium, PE after knee surgery at 48 years of age, and spontaneous DVT in 2017. He is on Xarelto now for at least 30 days.   He reports that he is doing well, and feels better than he has in awhile. No chest pain or dyspnea on exertion. No complications from right radial site. Tolerating medications well. He has cut back on cigarette use to 2-3 per day.   Past Medical History:  Diagnosis Date  . Allergic rhinitis   . Chronic asthma   . COPD (chronic obstructive pulmonary disease) (HCC)   . Coronary artery disease   . DVT (deep venous thrombosis) (HCC) 2004   with pulmonary embolism after knee surgery  . NSTEMI (non-ST elevated myocardial infarction) [facility](HCC)     Past Surgical History:  Procedure Laterality Date  . CARDIAC CATHETERIZATION    . CORONARY ANGIOPLASTY    . CORONARY STENT INTERVENTION N/A 01/01/2019   Procedure: CORONARY STENT INTERVENTION;  Surgeon: Yates DecampGanji, Jay, MD;  Location: [facility] INVAS[facility];  Service: Cardiovascular;  Laterality: N/A;  . CORONARY THROMBECTOMY N/A 01/01/2019   Procedure: Coronary Thrombectomy;  Surgeon: Yates DecampGanji, Jay, MD;   [facility];  Service: Cardiovascular;  Laterality: N/A;  . KNEE SURGERY     acl  . LEFT HEART CATH AND CORONARY ANGIOGRAPHY N/A 01/01/2019   Procedure: LEFT HEART CATH AND CORONARY ANGIOGRAPHY;  Surgeon: Yates DecampGanji, Jay, MD;  Location: [facility] INVASIVE CV LAB;  Service: Cardiovascular;  Laterality: N/A;  . TENDON REPAIR     rt wrist    Social History   Socioeconomic History  . Marital status: Single    Spouse name: Not on file  . Number of children: 1  . Years of education: Not on file  . Highest education level: Not on file  Occupational History  . Occupation: heat and Engineer, materialsair conditioning repair  Social Needs  . Financial resource strain: Not on file  . Food insecurity:    Worry: Not on file    Inability: Not on file  . Transportation needs:    Medical: Not on file    Non-medical: Not on file  Tobacco Use  . Smoking status: Current Every Day Smoker    Packs/day: 0.10    Years: 30.00    Pack years: 3.00    Types: Cigarettes  . Smokeless tobacco: Never Used  . Tobacco comment: 1-2 per day  Substance and Sexual Activity  . Alcohol use: Yes    Alcohol/week: 0.0 standard drinks  . Drug use: Never  . Sexual activity: Not on file  Lifestyle  . Physical activity:  Days per week: Not on file    Minutes per session: Not on file  . Stress: Not on file  Relationships  . Social connections:    Talks on phone: Not on file    Gets together: Not on file    Attends religious service: Not on file    Active member of club or organization: Not on file    Attends meetings of clubs or organizations: Not on file    Relationship status: Not on file  . Intimate partner violence:    Fear of current or ex partner: Not on file    Emotionally abused: Not on file    Physically abused: Not on file    Forced sexual activity: Not on file  Other Topics Concern  . Not on file  Social History Narrative  . Not on file    Review of Systems  Constitution: Negative for decreased  appetite, malaise/fatigue, weight gain and weight loss.  Eyes: Negative for visual disturbance.  Cardiovascular: Negative for chest pain, claudication, dyspnea on exertion, leg swelling, orthopnea, palpitations and syncope.  Respiratory: Positive for shortness of breath (asthma). Negative for hemoptysis and wheezing.   Endocrine: Negative for cold intolerance and heat intolerance.  Hematologic/Lymphatic: Does not bruise/bleed easily.  Skin: Negative for nail changes.  Musculoskeletal: Negative for muscle weakness and myalgias.  Gastrointestinal: Negative for abdominal pain, change in bowel habit, nausea and vomiting.  Neurological: Negative for difficulty with concentration, dizziness, focal weakness and headaches.  Psychiatric/Behavioral: Negative for altered mental status and suicidal ideas.  All other systems reviewed and are negative.     Objective:  Blood pressure 115/79, pulse 73, temperature 98.2 F (36.8 C), height 6\' 3"  (1.905 m), weight 262 lb (118.8 kg), SpO2 98 %. Body mass index is 32.75 kg/m.    Physical Exam  Constitutional: He is oriented to person, place, and time. Vital signs are normal. He appears well-developed and well-nourished.  HENT:  Head: Normocephalic and atraumatic.  Neck: Normal range of motion.  Cardiovascular: Normal rate, regular rhythm, normal heart sounds and intact distal pulses.  Pulmonary/Chest: Effort normal and breath sounds normal. No accessory muscle usage. No respiratory distress.  Abdominal: Soft. Bowel sounds are normal.  Musculoskeletal: Normal range of motion.  Neurological: He is alert and oriented to person, place, and time.  Skin: Skin is warm and dry.  Vitals reviewed.  Radiology: No results found.  Laboratory examination:    CMP Latest Ref Rng & Units 01/02/2019 01/01/2019 01/01/2019  Glucose 70 - 99 mg/dL 161(W152(H) - -  BUN 6 - 20 mg/dL 9 - -  Creatinine 9.600.61 - 1.24 mg/dL 4.541.05 - 0.980.90  Sodium 119135 - 145 mmol/L 139 139 -  Potassium  3.5 - 5.1 mmol/L 3.8 4.1 -  Chloride 98 - 111 mmol/L 98 - -  CO2 22 - 32 mmol/L 23 - -  Calcium 8.9 - 10.3 mg/dL 9.7 - -  Total Protein 6.5 - 8.1 g/dL - - -  Total Bilirubin 0.3 - 1.2 mg/dL - - -  Alkaline Phos 38 - 126 U/L - - -  AST 15 - 41 U/L - - -  ALT 0 - 44 U/L - - -   CBC Latest Ref Rng & Units 01/02/2019 01/01/2019 01/01/2019  WBC 4.0 - 10.5 K/uL 14.1(H) - 14.5(H)  Hemoglobin 13.0 - 17.0 g/dL 14.714.4 82.915.3 56.215.6  Hematocrit 39.0 - 52.0 % 43.0 45.0 47.8  Platelets 150 - 400 K/uL 318 - 333   Lipid Panel  Component Value Date/Time   CHOL 266 (H) 01/01/2019 2137   TRIG 110 01/01/2019 2137   HDL 45 01/01/2019 2137   CHOLHDL 5.9 01/01/2019 2137   VLDL 22 01/01/2019 2137   LDLCALC NOT CALCULATED 01/01/2019 2137   LDLDIRECT 156.5 (H) 01/02/2019 1239   HEMOGLOBIN A1C Lab Results  Component Value Date   HGBA1C 5.5 01/03/2019   MPG 111.15 01/03/2019   TSH Recent Labs    01/02/19 1239  TSH 0.737    PRN Meds:. There are no discontinued medications. Current Meds  Medication Sig  . albuterol (PROVENTIL HFA;VENTOLIN HFA) 108 (90 Base) MCG/ACT inhaler Inhale 2 puffs into the lungs every 6 (six) hours as needed for wheezing or shortness of breath.  Marland Kitchen aspirin EC 81 MG EC tablet Take 1 tablet (81 mg total) by mouth daily.  Marland Kitchen atorvastatin (LIPITOR) 80 MG tablet Take 1 tablet (80 mg total) by mouth daily at 6 PM.  . BRILINTA 90 MG TABS tablet Take 1 tablet by mouth 2 (two) times a day.  . carvedilol (COREG) 12.5 MG tablet Take 1 tablet (12.5 mg total) by mouth 2 (two) times daily with a meal.  . Fluticasone-Umeclidin-Vilant (TRELEGY ELLIPTA) 100-62.5-25 MCG/INH AEPB Inhale 1 puff into the lungs daily.  . nitroGLYCERIN (NITROSTAT) 0.4 MG SL tablet Place 1 tablet (0.4 mg total) under the tongue every 5 (five) minutes x 3 doses as needed for chest pain.  . rivaroxaban (XARELTO) 10 MG TABS tablet Take 1 tablet (10 mg total) by mouth daily with supper.  . ticagrelor (BRILINTA) 90 MG TABS  tablet Take 1 tablet (90 mg total) by mouth 2 (two) times daily.    Cardiac Studies:   Echo 01/02/2019:   1. The left ventricle has a visually estimated ejection fraction of 35%. The cavity size was normal. There is mildly increased left ventricular wall thickness. Left ventricular diastolic parameters were normal. Entire Anterior and inferoseptal  akinesis, apical akinesis and anterolateral akinesis  2. The right ventricle has normal systolic function. The cavity was normal. There is no increase in right ventricular wall thickness.  3. The aortic root is normal in size and structure.  4. The interatrial septum was not assessed.  Coronary angiogram 01/01/2019: Circumflex and right coronary artery normal.  LAD occluded in the ostium with thrombus S/P multiple aspiration thrombectomy with Pronto followed by stenting with 4.0 x 30 mm resolute, stenosis reduced from 100% to 0%, TIMI 0 to TIMI-3 flow.    Ostial LAD had thrombus burden, stent balloon was utilized at high pressure with embolization of thrombus into the circumflex.  Same guidewire was utilized and Pronto catheter was then advanced into the circumflex with multiple passes.  Significant reduction in thrombus burden but after aspiration was found to be in the left main and then eventually back to circumflex after multiple sections, this was left alone.  Assessment:   Atherosclerosis of native coronary artery of native heart without angina pectoris  Non-ST elevation (NSTEMI) myocardial infarction [facility]) - Plan: EKG 12-Lead  Ischemic cardiomyopathy  History of DVT (deep vein thrombosis)  Tobacco use disorder  EKG 01/12/2019: Normal sinus rhythm at rate of 68 bpm, rightward axis, anteroseptal infarct old. Cannot exclude anterolateral ischemia.  Recommendations:   Patient is presently doing well post procedure.  He states he is feeling the best he has in the last several years.  He is now on dual antiplatelet therapy along with  Xarelto in view of thrombus burden.  I have instructed him to  finish his current prescription of Xarelto and then will discontinue.  He is aware to continue with dual antiplatelet therapy for at least 1 year.  He will need hypercoagulable work-up after being off Xarelto for several weeks.  We will arrange for this to be performed in [redacted] weeks along with CMP and lipids to follow-up on hyperlipidemia since starting Lipitor.    Found to have ischemic cardiomyopathy with LVEF of 35% while in the hospital, no clinical evidence of heart failure.  He will need repeat echocardiogram in 3 months for surveillance.  We will start lisinopril 10 mg daily in view of his cardiomyopathy.  Hypertension is well controlled with current medications.  Right radial access site is healing well, no complications.  I have encouraged him to continue to work towards weight loss with diet modifications and slowly increasing his activity level.  We will see him back in 6 to 8 weeks for follow-up.   *I have discussed this case with Dr. Jacinto HalimGanji and he personally examined the patient and participated in formulating the plan.*   Toniann FailAshton Haynes Elianne Gubser, MSN, APRN, FNP-C [facility] Office: [phone] Fax: [phone]

## 2019-01-14 ENCOUNTER — Other Ambulatory Visit: Payer: Self-pay

## 2019-01-14 DIAGNOSIS — I2109 ST elevation (STEMI) myocardial infarction involving other coronary artery of anterior wall: Secondary | ICD-10-CM

## 2019-01-14 MED ORDER — BRILINTA 90 MG PO TABS: 90.0000 mg | ORAL_TABLET | Freq: Two times a day (BID) | ORAL | 2 refills | Status: DC

## 2019-01-14 MED ORDER — TICAGRELOR 90 MG PO TABS: 90.0000 mg | ORAL_TABLET | Freq: Two times a day (BID) | ORAL | 3 refills | Status: DC

## 2019-01-25 ENCOUNTER — Other Ambulatory Visit: Payer: Self-pay | Admitting: Cardiology

## 2019-01-25 DIAGNOSIS — I2109 ST elevation (STEMI) myocardial infarction involving other coronary artery of anterior wall: Secondary | ICD-10-CM

## 2019-02-04 ENCOUNTER — Other Ambulatory Visit: Payer: Self-pay

## 2019-02-04 DIAGNOSIS — I214 Non-ST elevation (NSTEMI) myocardial infarction: Secondary | ICD-10-CM

## 2019-02-04 MED ORDER — CARVEDILOL 12.5 MG PO TABS: 12.5000 mg | ORAL_TABLET | Freq: Two times a day (BID) | ORAL | 1 refills | Status: DC

## 2019-02-09 ENCOUNTER — Other Ambulatory Visit: Payer: Self-pay | Admitting: Cardiology

## 2019-02-10 ENCOUNTER — Other Ambulatory Visit: Payer: Self-pay

## 2019-02-10 ENCOUNTER — Telehealth: Payer: Self-pay | Admitting: Internal Medicine

## 2019-02-10 DIAGNOSIS — I214 Non-ST elevation (NSTEMI) myocardial infarction: Secondary | ICD-10-CM

## 2019-02-10 DIAGNOSIS — I2109 ST elevation (STEMI) myocardial infarction involving other coronary artery of anterior wall: Secondary | ICD-10-CM

## 2019-02-10 MED ORDER — TRELEGY ELLIPTA 100-62.5-25 MCG/INH IN AEPB: 1.0000 | INHALATION_SPRAY | Freq: Every day | RESPIRATORY_TRACT | 3 refills | Status: DC

## 2019-02-10 MED ORDER — CARVEDILOL 12.5 MG PO TABS: 12.5000 mg | ORAL_TABLET | Freq: Two times a day (BID) | ORAL | 1 refills | Status: DC

## 2019-02-10 MED ORDER — BRILINTA 90 MG PO TABS: 90.0000 mg | ORAL_TABLET | Freq: Two times a day (BID) | ORAL | 1 refills | Status: DC

## 2019-02-10 MED ORDER — ATORVASTATIN CALCIUM 80 MG PO TABS: 80.0000 mg | ORAL_TABLET | Freq: Every day | ORAL | 1 refills | Status: DC

## 2019-02-10 MED ORDER — ALBUTEROL SULFATE HFA 108 (90 BASE) MCG/ACT IN AERS: 2.0000 | INHALATION_SPRAY | Freq: Four times a day (QID) | RESPIRATORY_TRACT | 3 refills | Status: DC | PRN

## 2019-02-10 NOTE — Telephone Encounter (Signed)
Called and spoke with pt. Pt stated due to insurance, he now needs to have his inhalers sent to pharmacy as a 90-day supply. I verified pt's preferred pharmacy and have sent both meds to pharmacy for him instead of printing the Rx out. Nothing further needed.

## 2019-02-12 ENCOUNTER — Encounter (HOSPITAL_COMMUNITY): Payer: Self-pay | Admitting: Cardiology

## 2019-02-18 ENCOUNTER — Ambulatory Visit (INDEPENDENT_AMBULATORY_CARE_PROVIDER_SITE_OTHER): Payer: 59

## 2019-02-18 ENCOUNTER — Other Ambulatory Visit: Payer: Self-pay

## 2019-02-18 DIAGNOSIS — I255 Ischemic cardiomyopathy: Secondary | ICD-10-CM

## 2019-02-18 DIAGNOSIS — I2510 Atherosclerotic heart disease of native coronary artery without angina pectoris: Secondary | ICD-10-CM

## 2019-02-18 LAB — COMPREHENSIVE METABOLIC PANEL
ALT: 48 IU/L — ABNORMAL HIGH (ref 0–44)
AST: 29 IU/L (ref 0–40)
Albumin/Globulin Ratio: 1.6 (ref 1.2–2.2)
Albumin: 4.7 g/dL (ref 4.0–5.0)
Alkaline Phosphatase: 73 IU/L (ref 39–117)
BUN/Creatinine Ratio: 10 (ref 9–20)
BUN: 10 mg/dL (ref 6–24)
Bilirubin Total: 0.3 mg/dL (ref 0.0–1.2)
CO2: 24 mmol/L (ref 20–29)
Calcium: 10.2 mg/dL (ref 8.7–10.2)
Chloride: 101 mmol/L (ref 96–106)
Creatinine, Ser: 0.98 mg/dL (ref 0.76–1.27)
GFR calc Af Amer: 105 mL/min/{1.73_m2} (ref >59)
GFR calc non Af Amer: 91 mL/min/{1.73_m2} (ref >59)
Globulin, Total: 2.9 g/dL (ref 1.5–4.5)
Glucose: 106 mg/dL — ABNORMAL HIGH (ref 65–99)
Potassium: 5.2 mmol/L (ref 3.5–5.2)
Sodium: 141 mmol/L (ref 134–144)
Total Protein: 7.6 g/dL (ref 6.0–8.5)

## 2019-02-18 LAB — HYPERCOAGULABLE PANEL, COMPREHENSIVE
APTT: 24 s
AT III Act/Nor PPP Chro: 98 %
Act. Prt C Resist w/FV Defic.: 2.7 ratio
Anticardiolipin Ab, IgG: <10 [GPL'U]
Anticardiolipin Ab, IgM: <10 [MPL'U]
Beta-2 Glycoprotein I, IgA: <10 SAU
Beta-2 Glycoprotein I, IgG: <10 SGU
Beta-2 Glycoprotein I, IgM: <10 SMU
DRVVT Screen Seconds: 27.9 s
Factor VII Antigen**: 147 %
Factor VIII Activity: 211 % — ABNORMAL HIGH
Hexagonal Phospholipid Neutral: 0 s
Homocysteine: 10.1 umol/L
Prot C Ag Act/Nor PPP Imm: 98 %
Prot S Ag Act/Nor PPP Imm: 96 %
Protein C Ag/FVII Ag Ratio**: 0.7 ratio
Protein S Ag/FVII Ag Ratio**: 0.7 ratio

## 2019-02-18 LAB — LIPID PANEL
Chol/HDL Ratio: 3.5 ratio (ref 0.0–5.0)
Cholesterol, Total: 129 mg/dL (ref 100–199)
HDL: 37 mg/dL — ABNORMAL LOW (ref >39)
LDL Calculated: 68 mg/dL (ref 0–99)
Triglycerides: 121 mg/dL (ref 0–149)
VLDL Cholesterol Cal: 24 mg/dL (ref 5–40)

## 2019-02-19 ENCOUNTER — Telehealth: Payer: Self-pay

## 2019-02-19 NOTE — Telephone Encounter (Signed)
Nevin Bloodgood nurse case manager from [employer] called to request  Lab and echo results for patient. Advised that results are not in yet  Patient has to get blood work done. Will contact when done

## 2019-02-22 NOTE — Progress Notes (Signed)
Unable to leave vm.

## 2019-02-25 NOTE — Telephone Encounter (Signed)
Faxed papers to [place] on 02/24/19, papers on clipboard on my desk

## 2019-03-01 ENCOUNTER — Ambulatory Visit: Payer: 59 | Admitting: Cardiology

## 2019-03-15 ENCOUNTER — Ambulatory Visit (INDEPENDENT_AMBULATORY_CARE_PROVIDER_SITE_OTHER): Payer: 59 | Admitting: Cardiology

## 2019-03-15 ENCOUNTER — Other Ambulatory Visit: Payer: Self-pay

## 2019-03-15 ENCOUNTER — Encounter: Payer: Self-pay | Admitting: Cardiology

## 2019-03-15 VITALS — BP 108/73 | HR 76 | Temp 98.5°F | Ht 75.0 in | Wt 263.3 lb

## 2019-03-15 DIAGNOSIS — R899 Unspecified abnormal finding in specimens from other organs, systems and tissues: Secondary | ICD-10-CM

## 2019-03-15 DIAGNOSIS — I5042 Chronic combined systolic (congestive) and diastolic (congestive) heart failure: Secondary | ICD-10-CM

## 2019-03-15 DIAGNOSIS — I82401 Acute embolism and thrombosis of unspecified deep veins of right lower extremity: Secondary | ICD-10-CM

## 2019-03-15 DIAGNOSIS — F17200 Nicotine dependence, unspecified, uncomplicated: Secondary | ICD-10-CM

## 2019-03-15 DIAGNOSIS — I255 Ischemic cardiomyopathy: Secondary | ICD-10-CM

## 2019-03-15 DIAGNOSIS — I2510 Atherosclerotic heart disease of native coronary artery without angina pectoris: Secondary | ICD-10-CM

## 2019-03-15 MED ORDER — VALSARTAN 160 MG PO TABS: 160.0000 mg | ORAL_TABLET | Freq: Every day | ORAL | 4 refills | Status: DC

## 2019-03-15 NOTE — Progress Notes (Signed)
Primary Physician:  Levin Erp, MD   Patient ID: Tyler [name], male    DOB: [birth year], 48 y.o.   MRN: [id number]  Subjective:    Chief Complaint  Patient presents with  . Coronary Artery Disease  . Cardiomyopathy  . Results    labs  . Follow-up    6wk    HPI: Tyler [name]  is a 48 y.o. male  with  history of DVT of right leg in 2017, spontaneous, bronchial asthma and dyspnea, tobacco use disorder, presented on 01/01/19 with anterolateral STEMI, in which he underwent urgent cath S/P Primary PCI/ thrombectomy and stenting to Ostial LAD with 4x30 mm Onyx DES. Thrombectomy to prox Cx. Noted to have ischemic cardiomyopathy with moderate LV dysfunction at 35%. Now presents for 3 month follow up.  Patient has suspected hypercoagulable state in view of thrombotic occlusion in the LAD with anterior MI on 01/01/19, PE after knee surgery at 48 years of age, and spontaneous DVT in 2017.   He reports that he is doing well, and feels better than he has in awhile. No chest pain or dyspnea on exertion. No complications from right radial site. Tolerating medications well. He has cut back on cigarette use to 2-3 per day.  Quit drinking alcohol.   Past Medical History:  Diagnosis Date  . Allergic rhinitis   . Chronic asthma   . COPD (chronic obstructive pulmonary disease) ([place])   . Coronary artery disease   . DVT (deep venous thrombosis) ([place]) 2004   with pulmonary embolism after knee surgery  . NSTEMI (non-ST elevated myocardial infarction) [facility])     Past Surgical History:  Procedure Laterality Date  . CARDIAC CATHETERIZATION    . CORONARY ANGIOPLASTY    . CORONARY STENT INTERVENTION N/A 01/01/2019   Procedure: CORONARY STENT INTERVENTION;  Surgeon: Adrian Prows, MD;  Location: [place] CV LAB;  Service: Cardiovascular;  Laterality: N/A;  . CORONARY THROMBECTOMY N/A 01/01/2019   Procedure: Coronary Thrombectomy;  Surgeon: Adrian Prows, MD;  Location: [place] CV LAB;   Service: Cardiovascular;  Laterality: N/A;  . KNEE SURGERY     acl  . LEFT HEART CATH AND CORONARY ANGIOGRAPHY N/A 01/01/2019   Procedure: LEFT HEART CATH AND CORONARY ANGIOGRAPHY;  Surgeon: Adrian Prows, MD;  Location: [place] CV LAB;  Service: Cardiovascular;  Laterality: N/A;  . TENDON REPAIR     rt wrist    Social History   Socioeconomic History  . Marital status: Single    Spouse name: Not on file  . Number of children: 1  . Years of education: Not on file  . Highest education level: Not on file  Occupational History  . Occupation: heat and Press photographer  Social Needs  . Financial resource strain: Not on file  . Food insecurity    Worry: Not on file    Inability: Not on file  . Transportation needs    Medical: Not on file    Non-medical: Not on file  Tobacco Use  . Smoking status: Current Every Day Smoker    Packs/day: 0.10    Years: 30.00    Pack years: 3.00    Types: Cigarettes  . Smokeless tobacco: Never Used  . Tobacco comment: 1-2 per day  Substance and Sexual Activity  . Alcohol use: Yes    Alcohol/week: 0.0 standard drinks    Comment: rarely  . Drug use: Never  . Sexual activity: Not on file  Lifestyle  . Physical  activity    Days per week: Not on file    Minutes per session: Not on file  . Stress: Not on file  Relationships  . Social Musicianconnections    Talks on phone: Not on file    Gets together: Not on file    Attends religious service: Not on file    Active member of club or organization: Not on file    Attends meetings of clubs or organizations: Not on file    Relationship status: Not on file  . Intimate partner violence    Fear of current or ex partner: Not on file    Emotionally abused: Not on file    Physically abused: Not on file    Forced sexual activity: Not on file  Other Topics Concern  . Not on file  Social History Narrative  . Not on file    Review of Systems  Constitution: Negative for decreased appetite,  malaise/fatigue, weight gain and weight loss.  Eyes: Negative for visual disturbance.  Cardiovascular: Negative for chest pain, claudication, dyspnea on exertion, leg swelling, orthopnea, palpitations and syncope.  Respiratory: Positive for shortness of breath (asthma). Negative for hemoptysis and wheezing.   Endocrine: Negative for cold intolerance and heat intolerance.  Hematologic/Lymphatic: Does not bruise/bleed easily.  Skin: Negative for nail changes.  Musculoskeletal: Negative for muscle weakness and myalgias.  Gastrointestinal: Negative for abdominal pain, change in bowel habit, nausea and vomiting.  Neurological: Negative for difficulty with concentration, dizziness, focal weakness and headaches.  Psychiatric/Behavioral: Negative for altered mental status and suicidal ideas.  All other systems reviewed and are negative.     Objective:  Blood pressure 108/73, pulse 76, temperature 98.5 F (36.9 C), height 6\' 3"  (1.905 m), weight 263 lb 4.8 oz (119.4 kg), SpO2 97 %. Body mass index is 32.91 kg/m.    Physical Exam  Constitutional: He is oriented to person, place, and time. He appears well-developed and well-nourished.  Mildly obese  HENT:  Head: Normocephalic and atraumatic.  Neck: Normal range of motion.  Cardiovascular: Normal rate, regular rhythm, normal heart sounds and intact distal pulses.  Pulmonary/Chest: Effort normal and breath sounds normal. No accessory muscle usage. No respiratory distress.  Abdominal: Soft. Bowel sounds are normal.  Musculoskeletal: Normal range of motion.  Neurological: He is oriented to person, place, and time.  Skin: Skin is warm and dry.  Vitals reviewed.  Radiology: No results found.  Laboratory examination:    CMP Latest Ref Rng & Units 02/09/2019 01/02/2019 01/01/2019  Glucose 65 - 99 mg/dL 161(W106(H) 960(A152(H) -  BUN 6 - 24 mg/dL 10 9 -  Creatinine 5.400.76 - 1.27 mg/dL 9.810.98 1.911.05 -  Sodium 478134 - 144 mmol/L 141 139 139  Potassium 3.5 - 5.2  mmol/L 5.2 3.8 4.1  Chloride 96 - 106 mmol/L 101 98 -  CO2 20 - 29 mmol/L 24 23 -  Calcium 8.7 - 10.2 mg/dL 29.510.2 9.7 -  Total Protein 6.0 - 8.5 g/dL 7.6 - -  Total Bilirubin 0.0 - 1.2 mg/dL 0.3 - -  Alkaline Phos 39 - 117 IU/L 73 - -  AST 0 - 40 IU/L 29 - -  ALT 0 - 44 IU/L 48(H) - -   CBC Latest Ref Rng & Units 01/02/2019 01/01/2019 01/01/2019  WBC 4.0 - 10.5 K/uL 14.1(H) - 14.5(H)  Hemoglobin 13.0 - 17.0 g/dL 62.114.4 30.815.3 65.715.6  Hematocrit 39.0 - 52.0 % 43.0 45.0 47.8  Platelets 150 - 400 K/uL 318 - 333  Lipid Panel     Component Value Date/Time   CHOL 129 02/09/2019 0835   TRIG 121 02/09/2019 0835   HDL 37 (L) 02/09/2019 0835   CHOLHDL 3.5 02/09/2019 0835   CHOLHDL 5.9 01/01/2019 2137   VLDL 22 01/01/2019 2137   LDLCALC 68 02/09/2019 0835   LDLDIRECT 156.5 (H) 01/02/2019 1239   HEMOGLOBIN A1C Lab Results  Component Value Date   HGBA1C 5.5 01/03/2019   MPG 111.15 01/03/2019   TSH Recent Labs    01/02/19 1239  TSH 0.737   Hypercoagulable panel 02/09/2019: Was negative except for Factor VIII Activity % 211High   02/09/2019  This correlates with increased thrombotic events especially DVTs. Elevated plasma factor VIII coagulant activity (VIII:C) is now accepted as an independent marker of increased thrombotic risk   PRN Meds:. Medications Discontinued During This Encounter  Medication Reason  . lisinopril (ZESTRIL) 10 MG tablet Allergic reaction   Current Meds  Medication Sig  . albuterol (VENTOLIN HFA) 108 (90 Base) MCG/ACT inhaler Inhale 2 puffs into the lungs every 6 (six) hours as needed for wheezing or shortness of breath.  Marland Kitchen. aspirin EC 81 MG EC tablet Take 1 tablet (81 mg total) by mouth daily.  Marland Kitchen. atorvastatin (LIPITOR) 80 MG tablet Take 1 tablet (80 mg total) by mouth daily at 6 PM.  . BRILINTA 90 MG TABS tablet Take 1 tablet (90 mg total) by mouth 2 (two) times a day.  . carvedilol (COREG) 12.5 MG tablet Take 1 tablet (12.5 mg total) by mouth 2 (two) times  daily with a meal.  . Fluticasone-Umeclidin-Vilant (TRELEGY ELLIPTA) 100-62.5-25 MCG/INH AEPB Inhale 1 puff into the lungs daily.  . nitroGLYCERIN (NITROSTAT) 0.4 MG SL tablet Place 1 tablet (0.4 mg total) under the tongue every 5 (five) minutes x 3 doses as needed for chest pain.  . [DISCONTINUED] lisinopril (ZESTRIL) 10 MG tablet Take 1 tablet (10 mg total) by mouth daily.    Cardiac Studies:   Coronary angiogram 01/01/2019: Circumflex and right coronary artery normal.  LAD occluded in the ostium with thrombus S/P multiple aspiration thrombectomy with Pronto followed by stenting with 4.0 x 30 mm resolute, stenosis reduced from 100% to 0%, TIMI 0 to TIMI-3 flow.    Echo- 02/18/2019 1. Moderately depressed LV systolic function with visual EF approx. 35%. Left ventricle cavity is normal in size. Moderate concentric hypertrophy of the left ventricle. Hypokinesia of septum, ant. wall and apex. Calculated EF 30%. 2. Mild tricuspid regurgitation. No evidence of pulmonary hypertension. 3. No significant change in LVEF c.f. echo. of 12/06/2018.  Assessment:     ICD-10-CM   1. Atherosclerosis of native coronary artery of native heart without angina pectoris  I25.10   2. Ischemic cardiomyopathy  I25.5   3. Tobacco use disorder  F17.200   4. Chronic combined systolic and diastolic heart failure (HCC)  W09.81I50.42 valsartan (DIOVAN) 160 MG tablet  5. Abnormal laboratory test  R89.9 Ambulatory referral to Hematology  6. Thromboembolism of deep veins of lower extremity, unspecified laterality (HCC)  I82.409      EKG 01/12/2019: Normal sinus rhythm at rate of 68 bpm, rightward axis, anteroseptal infarct old. Cannot exclude anterolateral ischemia.   Hypercoagulable panel 02/09/2019: Was negative except for Factor VIII Activity % 211High   02/09/2019  This correlates with increased thrombotic events especially DVTs. Elevated plasma factor VIII coagulant activity (VIII:C) is now accepted as an independent  marker of increased thrombotic risk  Recommendations:   He is presently doing well,  essentially except for chronic dyspnea, related to align bronchial asthma, he is doing well.  Unfortunately still continues to smoke.  I reviewed the results of the recently performed labs, lipids are improved significantly, CMP is within normal limits.  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  Also smoking relationship to hyper thrombotic state was again discussed with the patient.  I will make a referral for hematology to evaluate him.  After his acute myocardial infarction, had started him on Xarelto for about 6 weeks which he finished, we will continue dual antiplatelet therapy for now. (H/O PE 30 years ago, righ tleg DVT 2017, Thrombotic LAD occlusion 12/2018)  He has done up allergy to lisinopril with cough and constrict to feeling in his throat, suspect he probably has mild form of angioedema, we'll change it to valsartan 160 mg daily.  Office visit in 3 months.  I reviewed the echocardiogram, he may need repeat echocardiogram in 3 months to see if his EF improves.  Weight loss discussed.  Yates DecampJay Mihir Flanigan, MD, [facility] HospitalFACC 03/15/2019, 9:55 PM [facility] Pager: [phone] Office: [phone] If no answer Cell [phone]

## 2019-06-11 ENCOUNTER — Telehealth: Payer: Self-pay | Admitting: Internal Medicine

## 2019-06-11 MED ORDER — ALBUTEROL SULFATE HFA 108 (90 BASE) MCG/ACT IN AERS: 2.0000 | INHALATION_SPRAY | Freq: Four times a day (QID) | RESPIRATORY_TRACT | 0 refills | Status: DC | PRN

## 2019-06-11 NOTE — Telephone Encounter (Signed)
Spoke with pt, requesting 3 month supply of Ventolin to [facility] on [address]  This has been sent to preferred pharmacy.  Nothing further needed at this time- will close encounter.

## 2019-06-18 ENCOUNTER — Ambulatory Visit: Payer: 59 | Admitting: Cardiology

## 2019-06-21 ENCOUNTER — Encounter: Payer: Self-pay | Admitting: Cardiology

## 2019-06-21 ENCOUNTER — Other Ambulatory Visit: Payer: Self-pay

## 2019-06-21 ENCOUNTER — Ambulatory Visit (INDEPENDENT_AMBULATORY_CARE_PROVIDER_SITE_OTHER): Payer: 59 | Admitting: Cardiology

## 2019-06-21 VITALS — BP 122/79 | HR 67 | Temp 97.8°F | Ht 75.0 in | Wt 266.7 lb

## 2019-06-21 DIAGNOSIS — R899 Unspecified abnormal finding in specimens from other organs, systems and tissues: Secondary | ICD-10-CM | POA: Diagnosis not present

## 2019-06-21 DIAGNOSIS — I255 Ischemic cardiomyopathy: Secondary | ICD-10-CM

## 2019-06-21 DIAGNOSIS — I5042 Chronic combined systolic (congestive) and diastolic (congestive) heart failure: Secondary | ICD-10-CM | POA: Diagnosis not present

## 2019-06-21 DIAGNOSIS — I2510 Atherosclerotic heart disease of native coronary artery without angina pectoris: Secondary | ICD-10-CM

## 2019-06-21 NOTE — Progress Notes (Signed)
Primary Physician:  Nila Nephew, MD   Patient ID: Tyler [name], male    DOB: 1970/08/10, 48 y.o.   MRN: [id number]  Subjective:    Chief Complaint  Patient presents with  . Coronary Artery Disease    3 month follow up  . Congestive Heart Failure  . Follow-up    HPI: Tyler [name]  is a 48 y.o. male  with  history of DVT of right leg in 2017, spontaneous, bronchial asthma and dyspnea, tobacco use disorder, presented on 01/01/19 with anterolateral STEMI, in which he underwent urgent cath S/P Primary PCI/ thrombectomy and stenting to Ostial LAD with 4x30 mm Onyx DES. Thrombectomy to prox Cx. Noted to have ischemic cardiomyopathy with moderate LV dysfunction at 35%. Now presents for 3 month follow up.  Patient has suspected hypercoagulable state in view of thrombotic occlusion in the LAD with anterior MI on 01/01/19, PE after knee surgery at 48 years of age, and spontaneous DVT in 2017.   He is smokingcigarette use to 2-3 per day.  Quit drinking alcohol.  Since last office visit when I had initiated valsartan instead of lisinopril due to cough, states that he has noticed marked fatigue.  No PND or orthopnea, no chest pain, no leg edema, he has not used any sublingual nitroglycerin.  No change in his weight.  Past Medical History:  Diagnosis Date  . Allergic rhinitis   . Chronic asthma   . COPD (chronic obstructive pulmonary disease) (HCC)   . Coronary artery disease   . DVT (deep venous thrombosis) (HCC) 2004   with pulmonary embolism after knee surgery  . NSTEMI (non-ST elevated myocardial infarction) [facility])     Past Surgical History:  Procedure Laterality Date  . CARDIAC CATHETERIZATION    . CORONARY ANGIOPLASTY    . CORONARY STENT INTERVENTION N/A 01/01/2019   Procedure: CORONARY STENT INTERVENTION;  Surgeon: Yates Decamp, MD;  Location: [facility] INVASIVE CV LAB;  Service: Cardiovascular;  Laterality: N/A;  . CORONARY THROMBECTOMY N/A 01/01/2019   Procedure: Coronary  Thrombectomy;  Surgeon: Yates Decamp, MD;  Location: [facility]ASIVE CV LAB;  Service: Cardiovascular;  Laterality: N/A;  . KNEE SURGERY     acl  . LEFT HEART CATH AND CORONARY ANGIOGRAPHY N/A 01/01/2019   Procedure: LEFT HEART CATH AND CORONARY ANGIOGRAPHY;  Surgeon: Yates Decamp, MD;  Location: [facility] INVASIVE CV LAB;  Service: Cardiovascular;  Laterality: N/A;  . TENDON REPAIR     rt wrist    Social History   Socioeconomic History  . Marital status: Single    Spouse name: Not on file  . Number of children: 1  . Years of education: Not on file  . Highest education level: Not on file  Occupational History  . Occupation: heat and Engineer, materials  Social Needs  . Financial resource strain: Not on file  . Food insecurity    Worry: Not on file    Inability: Not on file  . Transportation needs    Medical: Not on file    Non-medical: Not on file  Tobacco Use  . Smoking status: Current Some Day Smoker    Packs/day: 0.10    Years: 30.00    Pack years: 3.00    Types: Cigarettes  . Smokeless tobacco: Never Used  . Tobacco comment: 1-2 per day  Substance and Sexual Activity  . Alcohol use: Yes    Alcohol/week: 0.0 standard drinks    Comment: rarely  . Drug use: Never  .  Sexual activity: Not on file  Lifestyle  . Physical activity    Days per week: Not on file    Minutes per session: Not on file  . Stress: Not on file  Relationships  . Social Herbalist on phone: Not on file    Gets together: Not on file    Attends religious service: Not on file    Active member of club or organization: Not on file    Attends meetings of clubs or organizations: Not on file    Relationship status: Not on file  . Intimate partner violence    Fear of current or ex partner: Not on file    Emotionally abused: Not on file    Physically abused: Not on file    Forced sexual activity: Not on file  Other Topics Concern  . Not on file  Social History Narrative  . Not on file    Review  of Systems  Constitution: Positive for malaise/fatigue. Negative for decreased appetite, weight gain and weight loss.  Eyes: Negative for visual disturbance.  Cardiovascular: Negative for chest pain, claudication, dyspnea on exertion, leg swelling, orthopnea, palpitations and syncope.  Respiratory: Positive for shortness of breath (asthma). Negative for hemoptysis and wheezing.   Endocrine: Negative for cold intolerance and heat intolerance.  Hematologic/Lymphatic: Does not bruise/bleed easily.  Skin: Negative for nail changes.  Musculoskeletal: Negative for muscle weakness and myalgias.  Gastrointestinal: Negative for abdominal pain, change in bowel habit, nausea and vomiting.  Neurological: Negative for difficulty with concentration, dizziness, focal weakness and headaches.  Psychiatric/Behavioral: Negative for altered mental status and suicidal ideas.  All other systems reviewed and are negative.     Objective:  Blood pressure 122/79, pulse 67, temperature 97.8 F (36.6 C), height 6\' 3"  (1.905 m), weight 266 lb 11.2 oz (121 kg), SpO2 98 %. Body mass index is 33.34 kg/m.    Physical Exam  Constitutional: He is oriented to person, place, and time. He appears well-developed and well-nourished.  Mildly obese  HENT:  Head: Normocephalic and atraumatic.  Neck: Normal range of motion.  Cardiovascular: Normal rate, regular rhythm, normal heart sounds and intact distal pulses.  Pulmonary/Chest: Effort normal and breath sounds normal. No accessory muscle usage. No respiratory distress.  Abdominal: Soft. Bowel sounds are normal.  Musculoskeletal: Normal range of motion.  Neurological: He is oriented to person, place, and time.  Skin: Skin is warm and dry.  Vitals reviewed.  Radiology: No results found.  Laboratory examination:    CMP Latest Ref Rng & Units 02/09/2019 01/02/2019 01/01/2019  Glucose 65 - 99 mg/dL 106(H) 152(H) -  BUN 6 - 24 mg/dL 10 9 -  Creatinine 0.76 - 1.27 mg/dL 0.98  1.05 -  Sodium 134 - 144 mmol/L 141 139 139  Potassium 3.5 - 5.2 mmol/L 5.2 3.8 4.1  Chloride 96 - 106 mmol/L 101 98 -  CO2 20 - 29 mmol/L 24 23 -  Calcium 8.7 - 10.2 mg/dL 10.2 9.7 -  Total Protein 6.0 - 8.5 g/dL 7.6 - -  Total Bilirubin 0.0 - 1.2 mg/dL 0.3 - -  Alkaline Phos 39 - 117 IU/L 73 - -  AST 0 - 40 IU/L 29 - -  ALT 0 - 44 IU/L 48(H) - -   CBC Latest Ref Rng & Units 01/02/2019 01/01/2019 01/01/2019  WBC 4.0 - 10.5 K/uL 14.1(H) - 14.5(H)  Hemoglobin 13.0 - 17.0 g/dL 14.4 15.3 15.6  Hematocrit 39.0 - 52.0 % 43.0 45.0  47.8  Platelets 150 - 400 K/uL 318 - 333   Lipid Panel     Component Value Date/Time   CHOL 129 02/09/2019 0835   TRIG 121 02/09/2019 0835   HDL 37 (L) 02/09/2019 0835   CHOLHDL 3.5 02/09/2019 0835   CHOLHDL 5.9 01/01/2019 2137   VLDL 22 01/01/2019 2137   LDLCALC 68 02/09/2019 0835   LDLDIRECT 156.5 (H) 01/02/2019 1239   HEMOGLOBIN A1C Lab Results  Component Value Date   HGBA1C 5.5 01/03/2019   MPG 111.15 01/03/2019   TSH Recent Labs    01/02/19 1239  TSH 0.737   Hypercoagulable panel 02/09/2019: Was negative except for Factor VIII Activity % 211High   02/09/2019  This correlates with increased thrombotic events especially DVTs. Elevated plasma factor VIII coagulant activity (VIII:C) is now accepted as an independent marker of increased thrombotic risk  PRN Meds:. There are no discontinued medications. Current Meds  Medication Sig  . albuterol (VENTOLIN HFA) 108 (90 Base) MCG/ACT inhaler Inhale 2 puffs into the lungs every 6 (six) hours as needed for wheezing or shortness of breath.  Marland Kitchen aspirin EC 81 MG EC tablet Take 1 tablet (81 mg total) by mouth daily.  Marland Kitchen atorvastatin (LIPITOR) 80 MG tablet Take 1 tablet (80 mg total) by mouth daily at 6 PM.  . BRILINTA 90 MG TABS tablet Take 1 tablet (90 mg total) by mouth 2 (two) times a day.  . carvedilol (COREG) 12.5 MG tablet Take 1 tablet (12.5 mg total) by mouth 2 (two) times daily with a meal.  .  Fluticasone-Umeclidin-Vilant (TRELEGY ELLIPTA) 100-62.5-25 MCG/INH AEPB Inhale 1 puff into the lungs daily.  . nitroGLYCERIN (NITROSTAT) 0.4 MG SL tablet Place 1 tablet (0.4 mg total) under the tongue every 5 (five) minutes x 3 doses as needed for chest pain.  . valsartan (DIOVAN) 160 MG tablet Take 1 tablet (160 mg total) by mouth daily.    Cardiac Studies:   Coronary angiogram 01/01/2019: Circumflex and right coronary artery normal.  LAD occluded in the ostium with thrombus S/P multiple aspiration thrombectomy with Pronto followed by stenting with 4.0 x 30 mm resolute, stenosis reduced from 100% to 0%, TIMI 0 to TIMI-3 flow.    Echo- 02/18/2019 1. Moderately depressed LV systolic function with visual EF approx. 35%. Left ventricle cavity is normal in size. Moderate concentric hypertrophy of the left ventricle. Hypokinesia of septum, ant. wall and apex. Calculated EF 30%. 2. Mild tricuspid regurgitation. No evidence of pulmonary hypertension. 3. No significant change in LVEF c.f. echo. of 12/06/2018.  Assessment:     ICD-10-CM   1. Atherosclerosis of native coronary artery of native heart without angina pectoris  I25.10 PCV ECHOCARDIOGRAM COMPLETE    CANCELED: EKG 12-Lead  2. Ischemic cardiomyopathy  I25.5 PCV ECHOCARDIOGRAM COMPLETE  3. Chronic combined systolic and diastolic heart failure (HCC)  R42.70 CANCELED: EKG 12-Lead  4. Abnormal laboratory test  R89.9    Factor VIII Activity % 211High   02/09/2019      EKG 01/12/2019: Normal sinus rhythm at rate of 68 bpm, rightward axis, anteroseptal infarct old. Cannot exclude anterolateral ischemia.   Hypercoagulable panel 02/09/2019: Was negative except for Factor VIII Activity % 211High   02/09/2019  This correlates with increased thrombotic events especially DVTs. Elevated plasma factor VIII coagulant activity (VIII:C) is now accepted as an independent marker of increased thrombotic risk  Recommendations:   He is presently doing  well, essentially except for chronic dyspnea, and also complains of fatigue.  States that it started with initiation of valsartan.  Unfortunately still continues to smoke.  We discussed the need for therapy for cardiomyopathy, I will discontinue valsartan, he will hold it for 10 days and I will start him on Entresto 24/26 for 1 week followed by 49/51 mg twice daily, samples given, patient will call us for refill once he starts this and tolerates this.  I would like to repeat echocardiogram in 4 to 6 weeks from now, would like to see him back in 2 months.  I had made a referral for hematology evaluation.  This is still pending. After his acute myocardial infarction, had started him on Xarelto for about 6 weeks which he finished, we will continue dual antiplatelet therapy for now. (H/O PE 30 years ago, righ tleg DVT 2017, Thrombotic LAD occlusion 12/2018).  His fatigue may be related to high-dose statins, but could also be related to sleep apnea although patient states that he has had sleep study about 4 to 5 years ago and was negative.  I have again discussed with him regarding weight, states that he would like to start this the beginning of the year 2021.  He wants to go on a special diet.  Yates DecampJay Gertha Lichtenberg, MD, [facility] Care[facility]ACC 06/21/2019, 9:15 AM [facility] Pager: [phone] Office: [phone] If no answer Cell [phone]

## 2019-06-30 IMAGING — DX PORTABLE CHEST - 1 VIEW
1 series · 1 of 1 positions shown · non-contrast
Comparison: 09/03/2017

CLINICAL DATA: Acute chest pain for 3 hours.

EXAM:
PORTABLE CHEST 1 VIEW

[chest ap]
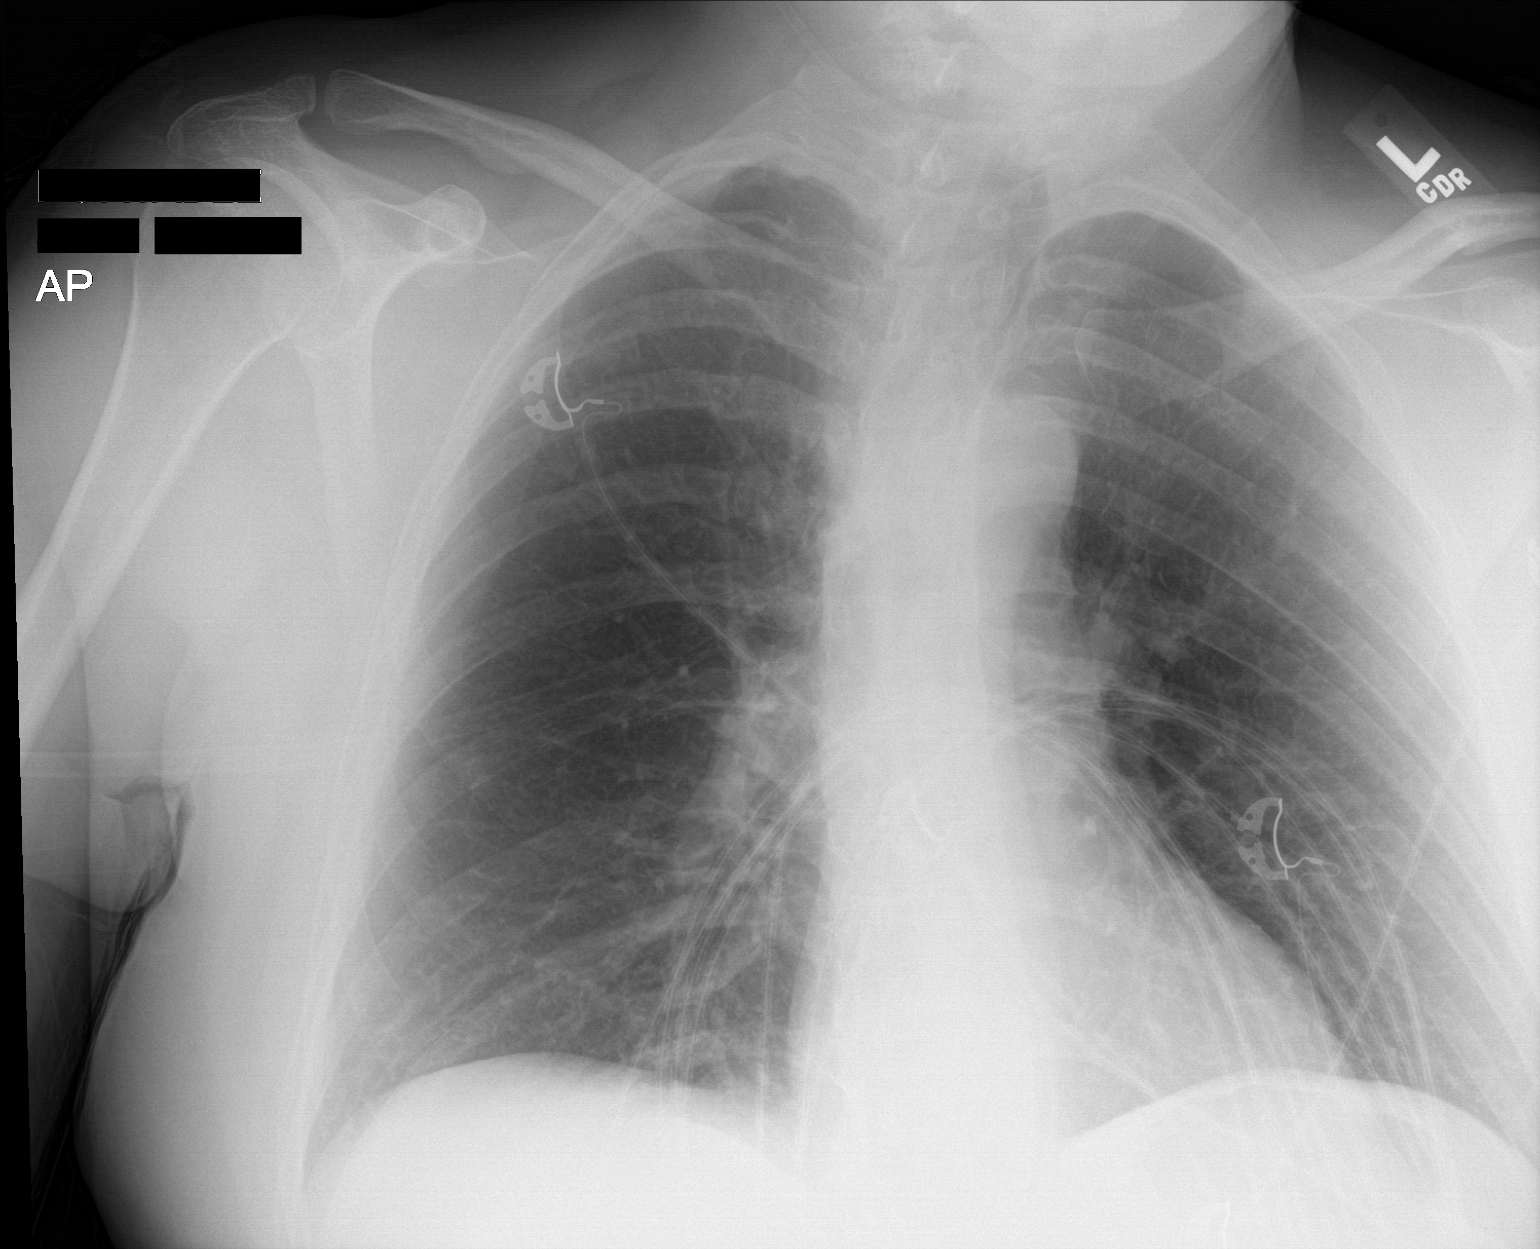

[1 of 1 positions shown; findings below may reference images not displayed]

FINDINGS: The heart size and mediastinal contours are within normal limits.
Both lungs are clear. Patient is rotated partially to the left.
IMPRESSION: No active disease.

## 2019-07-09 ENCOUNTER — Other Ambulatory Visit: Payer: Self-pay | Admitting: Cardiology

## 2019-07-09 DIAGNOSIS — I2510 Atherosclerotic heart disease of native coronary artery without angina pectoris: Secondary | ICD-10-CM

## 2019-07-15 ENCOUNTER — Other Ambulatory Visit: Payer: Self-pay

## 2019-07-15 ENCOUNTER — Ambulatory Visit (INDEPENDENT_AMBULATORY_CARE_PROVIDER_SITE_OTHER): Payer: 59

## 2019-07-15 DIAGNOSIS — I255 Ischemic cardiomyopathy: Secondary | ICD-10-CM | POA: Diagnosis not present

## 2019-07-15 DIAGNOSIS — I2510 Atherosclerotic heart disease of native coronary artery without angina pectoris: Secondary | ICD-10-CM

## 2019-07-26 ENCOUNTER — Other Ambulatory Visit: Payer: 59

## 2019-08-04 ENCOUNTER — Other Ambulatory Visit: Payer: Self-pay | Admitting: Cardiology

## 2019-08-04 DIAGNOSIS — I214 Non-ST elevation (NSTEMI) myocardial infarction: Secondary | ICD-10-CM

## 2019-08-09 ENCOUNTER — Ambulatory Visit: Payer: 59 | Admitting: Cardiology

## 2019-08-13 ENCOUNTER — Ambulatory Visit: Payer: 59 | Admitting: Cardiology

## 2019-08-30 ENCOUNTER — Other Ambulatory Visit: Payer: Self-pay

## 2019-08-30 ENCOUNTER — Encounter: Payer: Self-pay | Admitting: Cardiology

## 2019-08-30 ENCOUNTER — Ambulatory Visit (INDEPENDENT_AMBULATORY_CARE_PROVIDER_SITE_OTHER): Payer: 59 | Admitting: Cardiology

## 2019-08-30 VITALS — BP 138/88 | HR 70 | Temp 98.8°F | Ht 75.0 in | Wt 265.7 lb

## 2019-08-30 DIAGNOSIS — I255 Ischemic cardiomyopathy: Secondary | ICD-10-CM

## 2019-08-30 DIAGNOSIS — I5042 Chronic combined systolic (congestive) and diastolic (congestive) heart failure: Secondary | ICD-10-CM | POA: Diagnosis not present

## 2019-08-30 DIAGNOSIS — F17210 Nicotine dependence, cigarettes, uncomplicated: Secondary | ICD-10-CM

## 2019-08-30 DIAGNOSIS — I2510 Atherosclerotic heart disease of native coronary artery without angina pectoris: Secondary | ICD-10-CM

## 2019-08-30 DIAGNOSIS — E7800 Pure hypercholesterolemia, unspecified: Secondary | ICD-10-CM

## 2019-08-30 DIAGNOSIS — Z86718 Personal history of other venous thrombosis and embolism: Secondary | ICD-10-CM

## 2019-08-30 DIAGNOSIS — F17200 Nicotine dependence, unspecified, uncomplicated: Secondary | ICD-10-CM

## 2019-08-30 MED ORDER — VALSARTAN 80 MG PO TABS: 80.0000 mg | ORAL_TABLET | Freq: Every evening | ORAL | 3 refills | Status: DC

## 2019-08-30 NOTE — Progress Notes (Signed)
Primary Physician:  Levin Erp, MD   Patient ID: Tyler [name], male    DOB: March 30, 1971, 49 y.o.   MRN: [id number]  Subjective:    Chief Complaint  Patient presents with  . Coronary Artery Disease  . Cardiomyopathy  . Follow-up    73mo . Results    echo    HPI: Tyler [name] is a 49y.o. male  with  history of DVT of right leg in 2017, spontaneous, bronchial asthma and dyspnea, tobacco use disorder, presented on 01/01/19 with anterolateral STEMI, in which he underwent urgent cath S/P Primary PCI/ thrombectomy and stenting to Ostial LAD with 4x30 mm Onyx DES. Thrombectomy to prox Cx.  Now presents for 3 month follow up.  Patient has suspected hypercoagulable state in view of thrombotic occlusion in the LAD with anterior MI on 01/01/19, PE after knee surgery at 49years of age, and spontaneous DVT in 2017.   He is smoking cigarette use to 2-3 per day.  Quit drinking alcohol.  He was not able to tolerate Entresto due to marked fatigue as it dropped his blood pressure very low.  He could not tolerate even low-dose.  He did not tolerate valsartan 180 mg dose.   Past Medical History:  Diagnosis Date  . Allergic rhinitis   . Chronic asthma   . COPD ([place]ronic obstructive pulmonary disease) ([place]   . Coronary artery disease   . D[place]) ([place]   with pulmonary embolism after knee surgery  . NSTEMI (non-ST elevated myocardial infarction) ([facility]     Past Surgical History:  Procedure Laterality Date  . CARDIAC CATHETERIZATION    . CORONARY ANGIOPLASTY    . CORONARY STENT INTERVENTION N/A 01/01/2019   Procedure: CORONARY STENT INTERVENTION;  Surgeon: GAdrian Prows MD;  [place];  Service: Cardiovascular;  Laterality: N/A;  . CORONARY THROMBECTOMY N/A 01/01/2019   Procedure: Coronary Thrombectomy;  Surgeon: GAdrian Prows MD;  [place] [facility];  Service: Cardiovascular;  Laterality: N/A;  . KNEE SURGERY     acl  . LEFT  HEART CATH AND CORONARY ANGIOGRAPHY N/A 01/01/2019   Procedure: LEFT HEART CATH AND CORONARY ANGIOGRAPHY;  Surgeon: GAdrian Prows MD;  [facility];  Service: Cardiovascular;  Laterality: N/A;  . TENDON REPAIR     rt wrist    Social History   Socioeconomic History  . Marital status: Single    Spouse name: Not on file  . Number of children: 1  . Years of education: Not on file  . Highest education level: Not on file  Occupational History  . Occupation: heat and air conditioning repair  Tobacco Use  . Smoking status: Current Some Day Smoker    Packs/day: 0.10    Years: 30.00    Pack years: 3.00    Types: Cigarettes  . Smokeless tobacco: Never Used  . Tobacco comment: 1-2 per day  Substance and Sexual Activity  . Alcohol use: Yes    Alcohol/week: 0.0 standard drinks    Comment: beer weekly  . Drug use: Never  . Sexual activity: Not on file  Other Topics Concern  . Not on file  Social History Narrative  . Not on file   Social Determinants of Health   Financial Resource Strain:   . Difficulty of Paying Living Expenses: Not on file  Food Insecurity:   . Worried About RCharity fundraiserin the Last Year: Not on  file  . [place] in the Last Year: Not on file  Transportation Needs:   . Lack of Transportation (Medical): Not on file  . Lack of Transportation (Non-Medical): Not on file  Physical Activity:   . Days of Exercise per Week: Not on file  . Minutes of Exercise per Session: Not on file  Stress:   . Feeling of Stress : Not on file  Social Connections:   . Frequency of Communication with Friends and Family: Not on file  . Frequency of Social Gatherings with Friends and Family: Not on file  . Attends Religious Services: Not on file  . Active Member of Clubs or Organizations: Not on file  . Attends Archivist Meetings: Not on file  . Marital Status: Not on file  Intimate Partner Violence:   . Fear of Current or Ex-Partner: Not on file   . Emotionally Abused: Not on file  . Physically Abused: Not on file  . Sexually Abused: Not on file    Review of Systems  Constitution: Negative for weight gain.  Cardiovascular: Positive for dyspnea on exertion (chronic). Negative for leg swelling and syncope.  Respiratory: Positive for cough (chronic). Negative for hemoptysis.   Endocrine: Negative for cold intolerance.  Hematologic/Lymphatic: Does not bruise/bleed easily.  Gastrointestinal: Negative for hematochezia and melena.  Neurological: Negative for headaches and light-headedness.      Objective:  Blood pressure 138/88, pulse 70, temperature 98.8 F (37.1 C), height _0  (1.905 m), weight 265 lb 11.2 oz (120.5 kg), SpO2 96 %. Body mass index is 33.21 kg/m.  Vitals with BMI 08/30/2019 06/21/2019 03/15/2019  Height _1  _2  _3   Weight 265 lbs 11 oz 266 lbs 11 oz 263 lbs 5 oz  BMI 33.21 50.41 36.43  Systolic 837 793 968  Diastolic 88 79 73  Pulse 70 67 76      Physical Exam  Constitutional: He appears well-developed and well-nourished.  Mildly obese  HENT:  Head: Normocephalic and atraumatic.  Cardiovascular: Normal rate, regular rhythm, normal heart sounds and intact distal pulses.  Pulmonary/Chest: Effort normal and breath sounds normal. No accessory muscle usage. No respiratory distress.  Abdominal: Soft. Bowel sounds are normal.  Musculoskeletal:     Cervical back: Normal range of motion.  Skin: Skin is warm and dry.  Vitals reviewed.  Radiology: No results found.  Laboratory examination:    CMP Latest Ref Rng & Units 02/09/2019 01/02/2019 01/01/2019  Glucose 65 - 99 mg/dL 106(H) 152(H) -  BUN 6 - 24 mg/dL 10 9 -  Creatinine 0.76 - 1.27 mg/dL 0.98 1.05 -  Sodium 134 - 144 mmol/L 141 139 139  Potassium 3.5 - 5.2 mmol/L 5.2 3.8 4.1  Chloride 96 - 106 mmol/L 101 98 -  CO2 20 - 29 mmol/L 24 23 -  Calcium 8.7 - 10.2 mg/dL 10.2 9.7 -  Total Protein 6.0 - 8.5 g/dL 7.6 - -  Total Bilirubin 0.0 - 1.2 mg/dL  0.3 - -  Alkaline Phos 39 - 117 IU/L 73 - -  AST 0 - 40 IU/L 29 - -  ALT 0 - 44 IU/L 48(H) - -   CBC Latest Ref Rng & Units 01/02/2019 01/01/2019 01/01/2019  WBC 4.0 - 10.5 K/uL 14.1(H) - 14.5(H)  Hemoglobin 13.0 - 17.0 g/dL 14.4 15.3 15.6  Hematocrit 39.0 - 52.0 % 43.0 45.0 47.8  Platelets 150 - 400 K/uL 318 - 333   Lipid Panel  Component Value Date/Time   CHOL 129 02/09/2019 0835   TRIG 121 02/09/2019 0835   HDL 37 (L) 02/09/2019 0835   CHOLHDL 3.5 02/09/2019 0835   CHOLHDL 5.9 01/01/2019 2137   VLDL 22 01/01/2019 2137   LDLCALC 68 02/09/2019 0835   LDLDIRECT 156.5 (H) 01/02/2019 1239   HEMOGLOBIN A1C Lab Results  Component Value Date   HGBA1C 5.5 01/03/2019   MPG 111.15 01/03/2019   TSH Recent Labs    01/02/19 1239  TSH 0.737   Hypercoagulable panel 02/09/2019: Was negative except for Factor VIII Activity % 211High   02/09/2019  This correlates with increased thrombotic events especially DVTs. Elevated plasma factor VIII coagulant activity (VIII:C) is now accepted as an independent marker of increased thrombotic risk  PRN Meds:. Medications Discontinued During This Encounter  Medication Reason  . valsartan (DIOVAN) 160 MG tablet Discontinued by provider   Current Meds  Medication Sig  . albuterol (VENTOLIN HFA) 108 (90 Base) MCG/ACT inhaler Inhale 2 puffs into the lungs every 6 (six) hours as needed for wheezing or shortness of breath.  Marland Kitchen aspirin EC 81 MG EC tablet Take 1 tablet (81 mg total) by mouth daily.  Marland Kitchen atorvastatin (LIPITOR) 80 MG tablet TAKE 1 TABLET (80 MG TOTAL) BY MOUTH DAILY AT 6 PM.  . BRILINTA 90 MG TABS tablet Take 1 tablet (90 mg total) by mouth 2 (two) times a day.  . carvedilol (COREG) 12.5 MG tablet TAKE 1 TABLET (12.5 MG TOTAL) BY MOUTH 2 (TWO) TIMES DAILY WITH A MEAL.  Marland Kitchen Fluticasone-Umeclidin-Vilant (TRELEGY ELLIPTA) 100-62.5-25 MCG/INH AEPB Inhale 1 puff into the lungs daily.  . nitroGLYCERIN (NITROSTAT) 0.4 MG SL tablet Place 1 tablet  (0.4 mg total) under the tongue every 5 (five) minutes x 3 doses as needed for chest pain.    Cardiac Studies:   Coronary angiogram 01/01/2019: Circumflex and right coronary artery normal.  LAD occluded in the ostium with thrombus S/P multiple aspiration thrombectomy with Pronto followed by stenting with 4.0 x 30 mm resolute, stenosis reduced from 100% to 0%, TIMI 0 to TIMI-3 flow.    Echocardiogram 07/15/2019:  Left ventricle cavity is minimally dilated. Normal diastolic filling pattern. Hypokinetic global wall motion. Mildly depressed LV systolic function with EF 42%. Dilated cardiomyopathy. Calculated EF 42%. Compared to 02/22/2019, EF appears to have improved from 30-35%   Assessment:     ICD-10-CM   1. Atherosclerosis of native coronary artery of native heart without angina pectoris  I25.10 EKG 12-Lead    CMP14+EGFR    CBC    CBC    CMP14+EGFR  2. Ischemic cardiomyopathy  I25.5   3. Chronic combined systolic and diastolic heart failure (HCC)  I50.42 valsartan (DIOVAN) 80 MG tablet  4. Tobacco use disorder  F17.200   5. Hypercholesteremia  E78.00 Lipid Panel With LDL/HDL Ratio    Lipid Panel With LDL/HDL Ratio  6. History of DVT (deep vein thrombosis)  Z86.718 Hypercoagulable panel, comprehensive    EKG 08/30/2019: Normal sinus rhythm with rate of 66 bpm, normal axis.  Anteroseptal infarct old.  No evidence of ischemia.  Normal QT interval.  No significant change from EKG 01/12/2019  Hypercoagulable panel 02/09/2019: Was negative except for Factor VIII Activity % 211High   02/09/2019  This correlates with increased thrombotic events especially DVTs. Elevated plasma factor VIII coagulant activity (VIII:C) is now accepted as an independent marker of increased thrombotic risk  Recommendations:    Tyler [name]  is a 49 y.o. male  with  history of DVT of right leg in 2017, spontaneous, bronchial asthma and dyspnea, tobacco use disorder, presented on 01/01/19 with anterolateral  STEMI, in which he underwent urgent cath S/P Primary PCI/ thrombectomy and stenting to Ostial LAD with 4x30 mm Onyx DES. Thrombectomy to prox Cx.  Now presents for 3 month follow up.  Patient has suspected hypercoagulable state in view of thrombotic occlusion in the LAD with anterior MI on 01/01/19, PE after knee surgery at 30 years of "LVEF, and spontaneous DVT in 2017.  I reviewed his echocardiogram, fortunately ejection fraction is improved to around 40%.  No clinical evidence of heart failure.  He is trying his best to quit smoking.  Blood pressures well controlled.  He is willing to try valsartan again but will start him at 80 mg dose in the evening.  Lipids are well controlled on high-dose intensity statin, will check a CBC, CMP and also lipid profile testing in 6 months and I like to see him back then.  He will be screened for "[facility]" LPA trial.  Adrian Prows, MD, [facility] 08/30/2019, 10:25 AM [facility]

## 2019-08-31 ENCOUNTER — Encounter: Payer: Self-pay | Admitting: Internal Medicine

## 2019-08-31 ENCOUNTER — Other Ambulatory Visit: Payer: Self-pay

## 2019-08-31 ENCOUNTER — Ambulatory Visit (INDEPENDENT_AMBULATORY_CARE_PROVIDER_SITE_OTHER): Payer: 59

## 2019-08-31 ENCOUNTER — Ambulatory Visit (INDEPENDENT_AMBULATORY_CARE_PROVIDER_SITE_OTHER): Payer: 59 | Admitting: Internal Medicine

## 2019-08-31 VITALS — BP 118/76 | HR 69 | Temp 98.1°F | Ht 75.0 in | Wt 268.2 lb

## 2019-08-31 DIAGNOSIS — F17200 Nicotine dependence, unspecified, uncomplicated: Secondary | ICD-10-CM | POA: Diagnosis not present

## 2019-08-31 DIAGNOSIS — I2109 ST elevation (STEMI) myocardial infarction involving other coronary artery of anterior wall: Secondary | ICD-10-CM | POA: Diagnosis not present

## 2019-08-31 DIAGNOSIS — J449 Chronic obstructive pulmonary disease, unspecified: Secondary | ICD-10-CM | POA: Diagnosis not present

## 2019-08-31 MED ORDER — ALBUTEROL SULFATE HFA 108 (90 BASE) MCG/ACT IN AERS: 2.0000 | INHALATION_SPRAY | Freq: Four times a day (QID) | RESPIRATORY_TRACT | 3 refills | Status: DC | PRN

## 2019-08-31 MED ORDER — TRELEGY ELLIPTA 100-62.5-25 MCG/INH IN AEPB: 1.0000 | INHALATION_SPRAY | Freq: Every day | RESPIRATORY_TRACT | 3 refills | Status: DC

## 2019-08-31 NOTE — Assessment & Plan Note (Signed)
He admits only 1-2 cigarettes daily. This may be an underestimate.  Discussed smoking hx and pressed him to quit. This is habit, not likely addiction at this rate of use.

## 2019-08-31 NOTE — Progress Notes (Signed)
HPI male smoker followed for asthma/COPD, allergic rhinitis, tobacco use, complicated by history DVT Office spirometry 03/14/14- moderate to severe obstructive airways disease Office Spirometry 09/03/2018- Moderate obstructive airways disease.  FVC 4 0./67%, FEV1 2.4/51%, ratio 0.59, FEF 25-75% 1.4/34% ---------------------------------------------------------------------------  09/03/2018- 49 year old male smoker followed for asthma/COPD, allergic rhinitis, tobacco use, complicated by history DVT -----Asthma with COPD-no complaints Admits to smoking 1 or 2 cigarettes daily.  Body weight 267 pounds Albuterol HFA, neb albuterol, Breo 100, Dymista Uses Breo about every 3 days. Han's used nebulizer in 2-3 years, finding Breo sufficient. No major flares in a long time. CXR 09/03/2017-  IMPRESSION: No active cardiopulmonary disease. Office Spirometry 09/03/2018- Moderate obstructive airways disease.  FVC 4 0./67%, FEV1 2.4/51%, ratio 0.59, FEF 25-75% 1.4/34%  08/31/19- 49 year old male smoker followed for asthma/COPD, allergic rhinitis, tobacco use, complicated by history DVTx 2, CAD/ MI / CM Stent 01/03/2019, STEMI 12/2018. ECHO 07/15/2019 EF 42% -----f/u Asthma with COPD  Albuterol HFA, neb albuterol, Trelegy 100, Dymista Smoking still "1-2 cigs/ day" Breathing comfortable with little cough or wheeze. Trelegy helps. Pressed him to stop smoking. Infrequent need for rescue inhaler- device lasts 2 months. Dr Einar Gip [facility] following after MI last year.   ROS-see HPI    + = positive Constitutional:   No-   weight loss, night sweats, fevers, chills, fatigue, lassitude. HEENT:   No-  headaches, difficulty swallowing, tooth/dental problems, sore throat,       No-  sneezing, itching, ear ache,  +nasal congestion, post nasal drip,  CV:  No-   chest pain, orthopnea, PND, swelling in lower extremities, anasarca,                                                          dizziness, palpitations Resp: +  shortness of breath with exertion or at rest.               productive cough,  + non-productive cough,  No- coughing up of blood.              No-   change in color of mucus.   +wheezing.   Skin:  GI:  No-   heartburn, indigestion, abdominal pain, nausea, vomiting,  GU: . MS:  No-   joint pain or swelling.  Neuro-     nothing unusual  OBJ General- Alert, Oriented, Affect-appropriate, Distress- none acute, + odor of tobacco again noted, + overweight Skin- no rash Lymphadenopathy- none Head- atraumatic            Eyes- Gross vision intact, PERRLA, conjunctivae clear secretions            Ears- Hearing, canals-normal            Nose-+ mild turbinate edema, no-Septal dev, mucus, polyps, erosion, perforation             Throat- Mallampati II , mucosa clear , drainage- none, tonsils- atrophic Neck- flexible , trachea midline, no stridor , thyroid nl, carotid no bruit Chest - symmetrical excursion , unlabored           Heart/CV- RRR , no murmur , no gallop  , no rub, nl s1 s2                           -  JVD- none , edema- none, stasis changes- none, varices- none           Lung- + inspiratory wheeze, cough- none , dullness-none, rub- none           Chest wall-  Abd-  Br/ Gen/ Rectal- Not done, not indicated Extrem- cyanosis- none, clubbing, none, atrophy- none, strength- nl Neuro- grossly intact to observation

## 2019-08-31 NOTE — Patient Instructions (Signed)
Please Please- get rid of those last cigarettes  Refills printed for Trelegy and Ventolin  Order- CXR    Dx COPD mixed type  Please call as needed

## 2019-08-31 NOTE — Assessment & Plan Note (Signed)
Denies angina. He follows with Dr Jacinto Halim Cardiology

## 2019-08-31 NOTE — Assessment & Plan Note (Signed)
Feeling well-controlled with infrequent need for rescue inhaler. Plan- smoking cessation, CXR

## 2019-10-29 ENCOUNTER — Other Ambulatory Visit: Payer: Self-pay | Admitting: Cardiology

## 2019-10-29 DIAGNOSIS — I2109 ST elevation (STEMI) myocardial infarction involving other coronary artery of anterior wall: Secondary | ICD-10-CM

## 2020-01-21 ENCOUNTER — Telehealth: Payer: Self-pay | Admitting: Internal Medicine

## 2020-01-21 MED ORDER — PREDNISONE 10 MG PO TABS: ORAL_TABLET | ORAL | 0 refills | Status: DC

## 2020-01-21 MED ORDER — AZITHROMYCIN 250 MG PO TABS: ORAL_TABLET | ORAL | 0 refills | Status: DC

## 2020-01-21 NOTE — Telephone Encounter (Signed)
Called and spoke with pt who stated he developed an itchy throat 5 days ago but states that he now feels like his head is all stopped up. Pt said he has a lot of congestion in head. No congestion or any symptoms with chest at this time but he wants to go ahead and get meds onboard before it lingers into his chest.  Pt states he has been taking benadryl but stated he has been having some problems sleeping due to being all stopped up. Pt feels like he has a sinus infection.  Pt is requesting to have prednisone and abx sent to pharmacy for him so he can have as he does not want symptoms to become worse. Dr. Maple Hudson, please advise.  Allergies  Allergen Reactions  . Entresto [Sacubitril-Valsartan] Other (See Comments)    Fatigue  . Lisinopril Cough    Throat constriction     Current Outpatient Medications:  .  albuterol (VENTOLIN HFA) 108 (90 Base) MCG/ACT inhaler, Inhale 2 puffs into the lungs every 6 (six) hours as needed for wheezing or shortness of breath., Disp: 54 g, Rfl: 3 .  aspirin EC 81 MG EC tablet, Take 1 tablet (81 mg total) by mouth daily., Disp: , Rfl:  .  atorvastatin (LIPITOR) 80 MG tablet, TAKE 1 TABLET (80 MG TOTAL) BY MOUTH DAILY AT 6 PM., Disp: 90 tablet, Rfl: 1 .  BRILINTA 90 MG TABS tablet, TAKE 1 TABLET (90 MG TOTAL) BY MOUTH 2 (TWO) TIMES A DAY., Disp: 180 tablet, Rfl: 1 .  carvedilol (COREG) 12.5 MG tablet, TAKE 1 TABLET (12.5 MG TOTAL) BY MOUTH 2 (TWO) TIMES DAILY WITH A MEAL., Disp: 180 tablet, Rfl: 1 .  Fluticasone-Umeclidin-Vilant (TRELEGY ELLIPTA) 100-62.5-25 MCG/INH AEPB, Inhale 1 puff into the lungs daily. Rinse mouth, Disp: 3 each, Rfl: 3 .  nitroGLYCERIN (NITROSTAT) 0.4 MG SL tablet, Place 1 tablet (0.4 mg total) under the tongue every 5 (five) minutes x 3 doses as needed for chest pain., Disp: 25 tablet, Rfl: 1 .  valsartan (DIOVAN) 80 MG tablet, Take 1 tablet (80 mg total) by mouth every evening., Disp: 90 tablet, Rfl: 3

## 2020-01-21 NOTE — Telephone Encounter (Signed)
Spoke with patient. He is aware of Dr. Roxy Cedar recs and verbalized understanding. Will go ahead and call in medications.   Nothing further needed at time of call.

## 2020-01-21 NOTE — Telephone Encounter (Signed)
Suggest offer- Prednisone 10 mg, # 20, 4 X 2 DAYS, 3 X 2 DAYS, 2 X 2 DAYS, 1 X 2 DAYS  Zpak   250 mg, # 6, 2 today then one daily

## 2020-02-11 ENCOUNTER — Ambulatory Visit: Payer: 59 | Admitting: Cardiology

## 2020-02-23 ENCOUNTER — Ambulatory Visit: Payer: 59 | Admitting: Cardiology

## 2020-02-27 IMAGING — DX DG CHEST 2V
2 series · 2 of 2 positions shown · non-contrast
Comparison: 01/01/2019

CLINICAL DATA: COPD mixed-type, asthma, follow-up

EXAM:
CHEST - 2 VIEW

[chest pa]
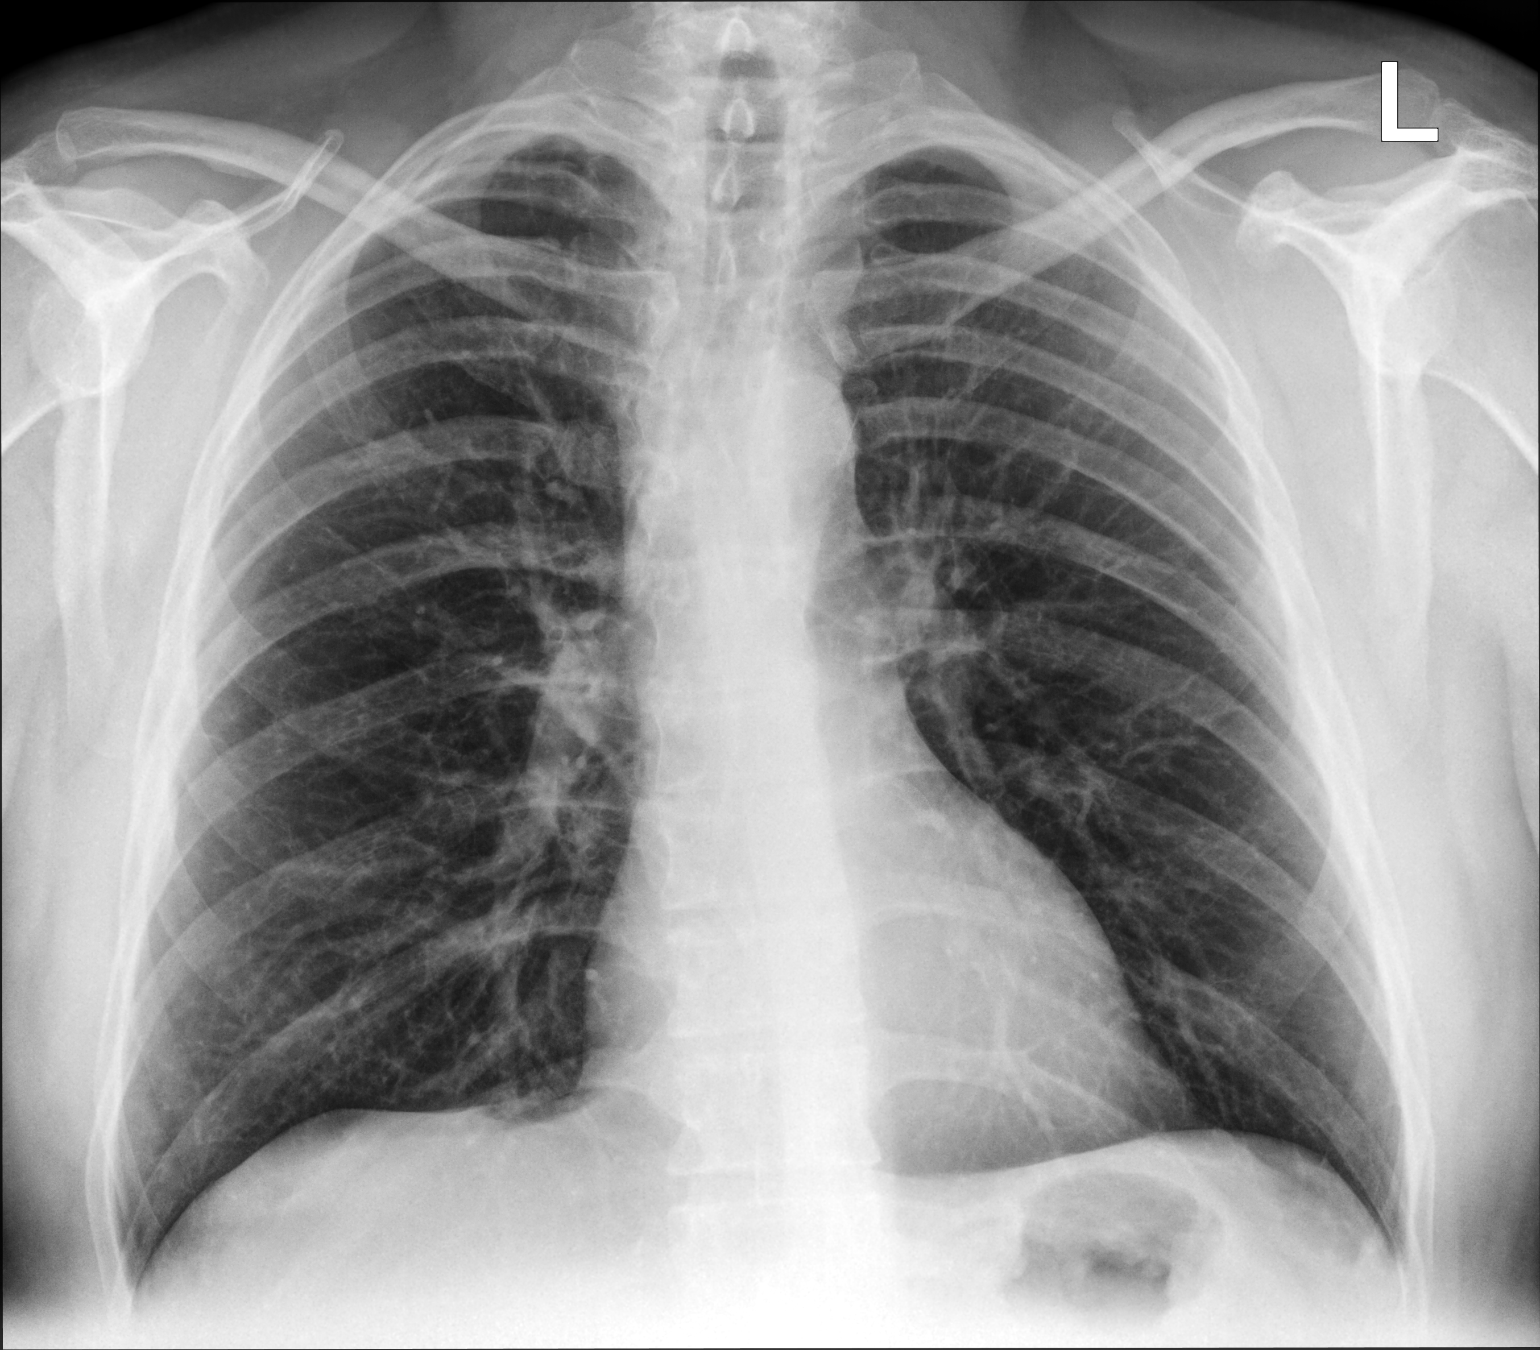

[chest lat]
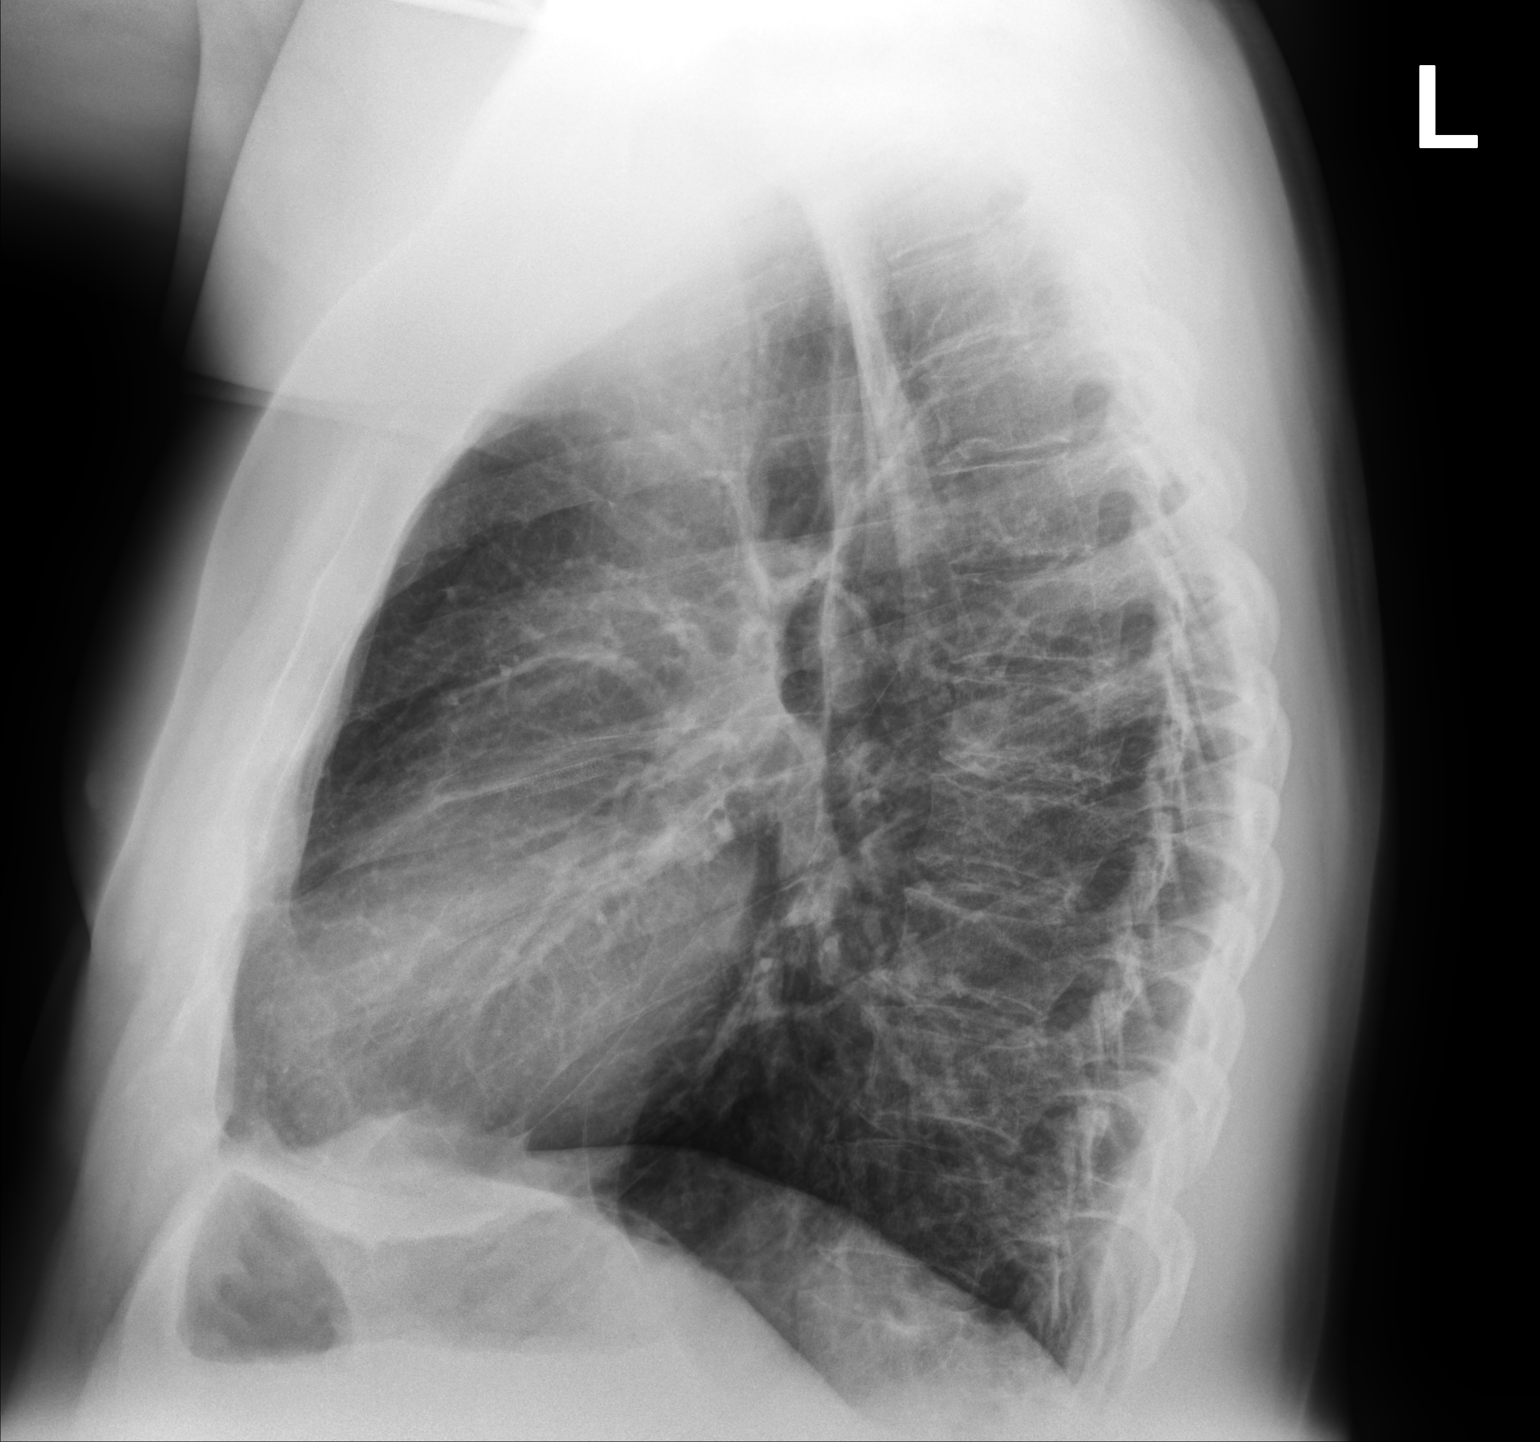

[2 of 2 positions shown; findings below may reference images not displayed]

FINDINGS: Normal heart size, mediastinal contours, and pulmonary vascularity.

Minimal peribronchial thickening.

Lungs clear.

No pulmonary infiltrate, pleural effusion or pneumothorax.

Minimal dextroconvex thoracic scoliosis.
IMPRESSION: Minimal bronchitic changes without acute infiltrate.

## 2020-04-12 ENCOUNTER — Other Ambulatory Visit: Payer: Self-pay | Admitting: Cardiology

## 2020-06-28 ENCOUNTER — Other Ambulatory Visit: Payer: Self-pay | Admitting: Cardiology

## 2020-06-28 DIAGNOSIS — I2109 ST elevation (STEMI) myocardial infarction involving other coronary artery of anterior wall: Secondary | ICD-10-CM

## 2020-07-24 ENCOUNTER — Ambulatory Visit: Payer: 59 | Admitting: Cardiology

## 2020-08-29 NOTE — Progress Notes (Signed)
HPI male smoker followed for asthma/COPD, allergic rhinitis, tobacco use, complicated by history DVT Office spirometry 03/14/14- moderate to severe obstructive airways disease Office Spirometry 09/03/2018- Moderate obstructive airways disease.  FVC 4 0./67%, FEV1 2.4/51%, ratio 0.59, FEF 25-75% 1.4/34% ---------------------------------------------------------------------------   08/31/19- 50 year old male smoker followed for asthma/COPD, allergic rhinitis, tobacco use, complicated by history DVTx 2, CAD/ MI / CM Stent 01/03/2019, STEMI 12/2018. ECHO 07/15/2019 EF 42% -----f/u Asthma with COPD  Albuterol HFA, neb albuterol, Trelegy 100, Dymista Smoking still "1-2 cigs/ day" Breathing comfortable with little cough or wheeze. Trelegy helps. Pressed him to stop smoking. Infrequent need for rescue inhaler- device lasts 2 months. Dr Jacinto Halim [facility] following after MI last year.  08/30/20- 50 year old male Smoker followed for asthma/COPD, allergic rhinitis, tobacco use, complicated by history DVTx 2, CAD/ MI / CM Stent 01/03/2019, STEMI 12/2018. ECHO 07/15/2019 EF 42% -albuterol hfa, Trelegy 100,  Covid vax-2 Phizer Flu vax-declines -----Patient feels a little tighter than normal due to the cold weather overall doing good. ACT score 21 Admits smoking 1-2 cigs occ if stressed, not every day. Using rescue hfa 0-1x/ day. Breathing doesn't wake him. CXR 08/31/19- IMPRESSION: Minimal bronchitic changes without acute infiltrate.   ROS-see HPI    + = positive Constitutional:   No-   weight loss, night sweats, fevers, chills, fatigue, lassitude. HEENT:   No-  headaches, difficulty swallowing, tooth/dental problems, sore throat,       No-  sneezing, itching, ear ache,  +nasal congestion, post nasal drip,  CV:  No-   chest pain, orthopnea, PND, swelling in lower extremities, anasarca,                                                          dizziness, palpitations Resp: + shortness of breath with exertion  or at rest.               productive cough,  + non-productive cough,  No- coughing up of blood.              No-   change in color of mucus.   +wheezing.   Skin:  GI:  No-   heartburn, indigestion, abdominal pain, nausea, vomiting,  GU: . MS:  No-   joint pain or swelling.  Neuro-     nothing unusual  OBJ General- Alert, Oriented, Affect-appropriate, Distress- none acute, + overweight Skin- no rash Lymphadenopathy- none Head- atraumatic            Eyes- Gross vision intact, PERRLA, conjunctivae clear secretions            Ears- Hearing, canals-normal            Nose-+ mild turbinate edema, no-Septal dev, mucus, polyps, erosion, perforation             Throat- Mallampati II , mucosa clear , drainage- none, tonsils- atrophic Neck- flexible , trachea midline, no stridor , thyroid nl, carotid no bruit Chest - symmetrical excursion , unlabored           Heart/CV- RRR , no murmur , no gallop  , no rub, nl s1 s2                           - JVD- none , edema- none,  stasis changes- none, varices- none           Lung- +quiet, clear, cough- none , dullness-none, rub- none           Chest wall-  Abd-  Br/ Gen/ Rectal- Not done, not indicated Extrem- cyanosis- none, clubbing, none, atrophy- none, strength- nl Neuro- grossly intact to observation

## 2020-08-30 ENCOUNTER — Encounter: Payer: Self-pay | Admitting: Internal Medicine

## 2020-08-30 ENCOUNTER — Other Ambulatory Visit: Payer: Self-pay

## 2020-08-30 ENCOUNTER — Ambulatory Visit (INDEPENDENT_AMBULATORY_CARE_PROVIDER_SITE_OTHER): Payer: 59 | Admitting: Internal Medicine

## 2020-08-30 DIAGNOSIS — J449 Chronic obstructive pulmonary disease, unspecified: Secondary | ICD-10-CM | POA: Diagnosis not present

## 2020-08-30 DIAGNOSIS — F17200 Nicotine dependence, unspecified, uncomplicated: Secondary | ICD-10-CM | POA: Diagnosis not present

## 2020-08-30 DIAGNOSIS — J4489 Other specified chronic obstructive pulmonary disease: Secondary | ICD-10-CM

## 2020-08-30 MED ORDER — ALBUTEROL SULFATE HFA 108 (90 BASE) MCG/ACT IN AERS: 2.0000 | INHALATION_SPRAY | Freq: Four times a day (QID) | RESPIRATORY_TRACT | 4 refills | Status: DC | PRN

## 2020-08-30 MED ORDER — TRELEGY ELLIPTA 100-62.5-25 MCG/INH IN AEPB: 1.0000 | INHALATION_SPRAY | Freq: Every day | RESPIRATORY_TRACT | 3 refills | Status: DC

## 2020-08-30 NOTE — Patient Instructions (Addendum)
Scripts printed for Trelegy  And for albuterol (Proair)   Good luck with the weight and getting off those last few cigarettes !  Please call if we can help

## 2020-10-16 ENCOUNTER — Encounter: Payer: Self-pay | Admitting: Internal Medicine

## 2020-10-16 NOTE — Assessment & Plan Note (Signed)
If true, his stated use of 1-2 cigs/ day isn't likely to maintain nicotine addiction. Plan- maintain pressure to stop completely

## 2020-10-16 NOTE — Assessment & Plan Note (Signed)
More stable with current regimen and less smoking Plan- refill Trelegy and Proair

## 2021-04-23 ENCOUNTER — Ambulatory Visit
Admission: EM | Admit: 2021-04-23 | Discharge: 2021-04-23 | Disposition: A | Discharge status: Home / Self Care | Payer: 59 | Attending: Emergency Medicine | Admitting: Emergency Medicine

## 2021-04-23 ENCOUNTER — Other Ambulatory Visit: Payer: Self-pay

## 2021-04-23 DIAGNOSIS — M545 Low back pain, unspecified: Secondary | ICD-10-CM

## 2021-04-23 MED ORDER — HALOBETASOL PROPIONATE 0.05 % EX OINT: TOPICAL_OINTMENT | Freq: Two times a day (BID) | CUTANEOUS | 0 refills | Status: DC

## 2021-04-23 MED ORDER — TIZANIDINE HCL 4 MG PO TABS: 4.0000 mg | ORAL_TABLET | Freq: Four times a day (QID) | ORAL | 0 refills | Status: DC | PRN

## 2021-04-23 MED ORDER — PREDNISONE 10 MG PO TABS: ORAL_TABLET | ORAL | 0 refills | Status: DC

## 2021-04-23 NOTE — Discharge Instructions (Signed)
Begin prednisone taper x6 days-begin with 6 tablets on day 1, decrease by 1 tablet each day until complete-6, 5, 4, 3, 2, 1-take with food and earlier in the day if possible Supplement with tizanidine at home/bedtime-this is muscle relaxer, will cause drowsiness, do not drive or work after taking I refilled halobetasol cream for you to Follow-up if not improving or

## 2021-04-23 NOTE — ED Triage Notes (Signed)
Pt reports pulled muscle in lower back that started last Tuesday.

## 2021-04-23 NOTE — ED Provider Notes (Signed)
[facility]    CSN: [id number] Arrival date & time: 04/23/21  1154      History   Chief Complaint Chief Complaint  Patient presents with   Muscle Pain    HPI Tyler [name] is a 50 y.o. male history of COPD, CAD, presenting today for evaluation of back pain.  Reports approximately 1 week ago bent over coughed and felt a pulling sensation in his lower back.  Since he has had continued discomfort and a knot-like sensation on the left side of his back although pain radiates into right side.  Denies any urinary symptoms.  Has had some slight increase in pain with hip flexion and extension of his leg.  History of similar in the past.  HPI  Past Medical History:  Diagnosis Date   Allergic rhinitis    Chronic asthma    COPD (chronic obstructive pulmonary disease) (HCC)    Coronary artery disease    DVT (deep venous thrombosis) (HCC) 2004   with pulmonary embolism after knee surgery   NSTEMI (non-ST elevated myocardial infarction) [facility])     Patient Active Problem List   Diagnosis Date Noted   NSTEMI (non-ST elevated myocardial infarction) (HCC)    Acute anterolateral wall MI (HCC) 01/01/2019   Acute MI, anterolateral wall, initial episode of care (HCC) 01/01/2019   Rash and nonspecific skin eruption 09/03/2016   Acute maxillary sinusitis 09/16/2014   TOBACCO ABUSE 08/10/2007   Thromboembolism of deep veins of lower extremity- hx 08/10/2007   Seasonal and perennial allergic rhinitis 08/10/2007   Asthma with COPD (chronic obstructive pulmonary disease) (HCC) 08/10/2007    Past Surgical History:  Procedure Laterality Date   CARDIAC CATHETERIZATION     CORONARY ANGIOPLASTY     CORONARY STENT INTERVENTION N/A 01/01/2019   Procedure: CORONARY STENT INTERVENTION;  Surgeon: Yates Decamp, MD;  Location: [facility] INVASIVE CV LAB;  Service: Cardiovascular;  Laterality: N/A;   CORONARY THROMBECTOMY N/A 01/01/2019   Procedure: Coronary Thrombectomy;  Surgeon: Yates Decamp, MD;   Location: [facility] INVASIVE CV LAB;  Service: Cardiovascular;  Laterality: N/A;   KNEE SURGERY     acl   LEFT HEART CATH AND CORONARY ANGIOGRAPHY N/A 01/01/2019   Procedure: LEFT HEART CATH AND CORONARY ANGIOGRAPHY;  Surgeon: Yates Decamp, MD;  Location: [facility] INVASIVE CV LAB;  Service: Cardiovascular;  Laterality: N/A;   TENDON REPAIR     rt wrist       Home Medications    Prior to Admission medications   Medication Sig Start Date End Date Taking? Authorizing Provider  halobetasol (ULTRAVATE) 0.05 % ointment Apply topically 2 (two) times daily. 04/23/21  Yes Prisha Hiley C, PA-C  predniSONE (DELTASONE) 10 MG tablet Begin with 6 tabs on day 1, 5 tab on day 2, 4 tab on day 3, 3 tab on day 4, 2 tab on day 5, 1 tab on day 6-take with food 04/23/21  Yes Ridhaan Dreibelbis C, PA-C  tiZANidine (ZANAFLEX) 4 MG tablet Take 1 tablet (4 mg total) by mouth every 6 (six) hours as needed for muscle spasms. 04/23/21  Yes Mckensey Berghuis C, PA-C  albuterol (VENTOLIN HFA) 108 (90 Base) MCG/ACT inhaler Inhale 2 puffs into the lungs every 6 (six) hours as needed for wheezing or shortness of breath. 08/30/20   Jetty Duhamel D, MD  aspirin EC 81 MG EC tablet Take 1 tablet (81 mg total) by mouth daily. 01/04/19   Yates Decamp, MD  atorvastatin (LIPITOR) 80 MG tablet TAKE 1 TABLET (  80 MG TOTAL) BY MOUTH DAILY AT 6 PM. 04/12/20   Yates Decamp, MD  BRILINTA 90 MG TABS tablet TAKE 1 TABLET (90 MG TOTAL) BY MOUTH 2 (TWO) TIMES A DAY. 06/28/20   Yates Decamp, MD  carvedilol (COREG) 12.5 MG tablet TAKE 1 TABLET (12.5 MG TOTAL) BY MOUTH 2 (TWO) TIMES DAILY WITH A MEAL. 08/04/19   Yates Decamp, MD  Fluticasone-Umeclidin-Vilant (TRELEGY ELLIPTA) 100-62.5-25 MCG/INH AEPB Inhale 1 puff into the lungs daily. Rinse mouth 08/30/20   Jetty Duhamel D, MD  nitroGLYCERIN (NITROSTAT) 0.4 MG SL tablet Place 1 tablet (0.4 mg total) under the tongue every 5 (five) minutes x 3 doses as needed for chest pain. 01/03/19   Yates Decamp, MD  valsartan (DIOVAN) 80 MG  tablet Take 1 tablet (80 mg total) by mouth every evening. 08/30/19   Yates Decamp, MD    Family History Family History  Problem Relation Age of Onset   Diabetes Mother    Heart disease Mother     Social History Social History   Tobacco Use   Smoking status: Some Days    Packs/day: 0.10    Years: 30.00    Pack years: 3.00    Types: Cigarettes   Smokeless tobacco: Never   Tobacco comments:    1-2 per day  Vaping Use   Vaping Use: Never used  Substance Use Topics   Alcohol use: Yes    Alcohol/week: 0.0 standard drinks    Comment: beer weekly   Drug use: Never     Allergies   Entresto [sacubitril-valsartan] and Lisinopril   Review of Systems Review of Systems  Constitutional:  Negative for fatigue and fever.  Eyes:  Negative for redness, itching and visual disturbance.  Respiratory:  Negative for shortness of breath.   Cardiovascular:  Negative for chest pain and leg swelling.  Gastrointestinal:  Negative for nausea and vomiting.  Musculoskeletal:  Positive for back pain and myalgias. Negative for arthralgias.  Skin:  Negative for color change, rash and wound.  Neurological:  Negative for dizziness, syncope, weakness, light-headedness and headaches.    Physical Exam Triage Vital Signs ED Triage Vitals  Enc Vitals Group     BP 04/23/21 1301 120/81     Pulse Rate 04/23/21 1301 86     Resp 04/23/21 1301 14     Temp 04/23/21 1305 98.8 F (37.1 C)     Temp src --      SpO2 04/23/21 1301 92 %     Weight --      Height --      Head Circumference --      Peak Flow --      Pain Score 04/23/21 1322 7     Pain Loc --      Pain Edu? --      Excl. in GC? --    No data found.  Updated Vital Signs BP 120/81 (BP Location: Left Arm)   Pulse 86   Temp 98.8 F (37.1 C)   Resp 14   SpO2 92%   Visual Acuity Right Eye Distance:   Left Eye Distance:   Bilateral Distance:    Right Eye Near:   Left Eye Near:    Bilateral Near:     Physical Exam Vitals and  nursing note reviewed.  Constitutional:      Appearance: He is well-developed.     Comments: No acute distress  HENT:     Head: Normocephalic and atraumatic.  Nose: Nose normal.  Eyes:     Conjunctiva/sclera: Conjunctivae normal.  Cardiovascular:     Rate and Rhythm: Normal rate.  Pulmonary:     Effort: Pulmonary effort is normal. No respiratory distress.  Abdominal:     General: There is no distension.  Musculoskeletal:        General: Normal range of motion.     Cervical back: Neck supple.     Comments: Nontender to palpation of thoracic and lumbar spine midline, no reproducible tenderness to palpation of bilateral lumbar musculature, strength at hips and knees 5/5 ankle bilaterally  Patient points to left paraspinal musculature and superior lumbar area as source of maximal pain with movement  Skin:    General: Skin is warm and dry.  Neurological:     Mental Status: He is alert and oriented to person, place, and time.     UC Treatments / Results  Labs (all labs ordered are listed, but only abnormal results are displayed) Labs Reviewed - No data to display  EKG   Radiology No results found.  Procedures Procedures (including critical care time)  Medications Ordered in UC Medications - No data to display  Initial Impression / Assessment and Plan / UC Course  I have reviewed the triage vital signs and the nursing notes.  Pertinent labs & imaging results that were available during my care of the patient were reviewed by me and considered in my medical decision making (see chart for details).     Low back pain-using NSAIDs without relief, will switch to prednisone taper, supplement tizanidine, discussed activity modification, continue heat  Refilled halobetasol for eczema-like/heat rash as needed per request  Discussed strict return precautions. Patient verbalized understanding and is agreeable with plan.  Final Clinical Impressions(s) / UC Diagnoses   Final  diagnoses:  Acute left-sided low back pain without sciatica     Discharge Instructions      Begin prednisone taper x6 days-begin with 6 tablets on day 1, decrease by 1 tablet each day until complete-6, 5, 4, 3, 2, 1-take with food and earlier in the day if possible Supplement with tizanidine at home/bedtime-this is muscle relaxer, will cause drowsiness, do not drive or work after taking I refilled halobetasol cream for you to Follow-up if not improving or     ED Prescriptions     Medication Sig Dispense Auth. Provider   predniSONE (DELTASONE) 10 MG tablet Begin with 6 tabs on day 1, 5 tab on day 2, 4 tab on day 3, 3 tab on day 4, 2 tab on day 5, 1 tab on day 6-take with food 21 tablet Taegan Haider C, PA-C   tiZANidine (ZANAFLEX) 4 MG tablet Take 1 tablet (4 mg total) by mouth every 6 (six) hours as needed for muscle spasms. 30 tablet Yoselyn Mcglade C, PA-C   halobetasol (ULTRAVATE) 0.05 % ointment Apply topically 2 (two) times daily. 50 g Abdinasir Spadafore, [place] C, PA-C      PDMP not reviewed this encounter.   Lew Dawes, PA-C 04/23/21 1411

## 2021-08-07 ENCOUNTER — Encounter: Payer: Self-pay | Admitting: Internal Medicine

## 2021-08-07 ENCOUNTER — Ambulatory Visit (INDEPENDENT_AMBULATORY_CARE_PROVIDER_SITE_OTHER): Payer: 59 | Admitting: Internal Medicine

## 2021-08-07 ENCOUNTER — Other Ambulatory Visit: Payer: Self-pay

## 2021-08-07 DIAGNOSIS — J449 Chronic obstructive pulmonary disease, unspecified: Secondary | ICD-10-CM

## 2021-08-07 DIAGNOSIS — I214 Non-ST elevation (NSTEMI) myocardial infarction: Secondary | ICD-10-CM | POA: Diagnosis not present

## 2021-08-07 DIAGNOSIS — J4489 Other specified chronic obstructive pulmonary disease: Secondary | ICD-10-CM

## 2021-08-07 DIAGNOSIS — F17200 Nicotine dependence, unspecified, uncomplicated: Secondary | ICD-10-CM | POA: Diagnosis not present

## 2021-08-07 MED ORDER — TRELEGY ELLIPTA 100-62.5-25 MCG/ACT IN AEPB: INHALATION_SPRAY | RESPIRATORY_TRACT | 4 refills | Status: DC

## 2021-08-07 MED ORDER — ALBUTEROL SULFATE HFA 108 (90 BASE) MCG/ACT IN AERS: 2.0000 | INHALATION_SPRAY | Freq: Four times a day (QID) | RESPIRATORY_TRACT | 4 refills | Status: DC | PRN

## 2021-08-07 NOTE — Patient Instructions (Signed)
Refills printed for Trelegy and albuterol rescue inhaler  Please call if we can help

## 2021-08-07 NOTE — Assessment & Plan Note (Signed)
He has cut way down and is close to quitting.  Strongly encouraged.

## 2021-08-07 NOTE — Assessment & Plan Note (Signed)
He denies recent events.  To continue follow-up with cardiology.

## 2021-08-07 NOTE — Assessment & Plan Note (Signed)
Moderate persistent uncomplicated.  Current medications are appropriate. Plan-refill inhalers with discussion.

## 2021-08-07 NOTE — Progress Notes (Signed)
HPI male smoker followed for asthma/COPD, allergic rhinitis, tobacco use, complicated by history DVT Office spirometry 03/14/14- moderate to severe obstructive airways disease Office Spirometry 09/03/2018- Moderate obstructive airways disease.  FVC 4 0./67%, FEV1 2.4/51%, ratio 0.59, FEF 25-75% 1.4/34% ---------------------------------------------------------------------------   08/30/20- 51 year old male Smoker followed for asthma/COPD, allergic rhinitis, tobacco use, complicated by history DVTx 2, CAD/ MI / CM Stent 01/03/2019, STEMI 12/2018. ECHO 07/15/2019 EF 42% -albuterol hfa, Trelegy 100,  Covid vax-2 Phizer Flu vax-declines -----Patient feels a little tighter than normal due to the cold weather overall doing good. ACT score 21 Admits smoking 1-2 cigs occ if stressed, not every day. Using rescue hfa 0-1x/ day. Breathing doesn't wake him. CXR 08/31/19- IMPRESSION: Minimal bronchitic changes without acute infiltrate.  08/07/21- 51 year old male Smoker followed for asthma/COPD, allergic rhinitis, tobacco use, complicated by history DVTx 2, CAD/ MI / CM Stent 01/03/2019, STEMI 12/2018. ECHO 07/15/2019 EF 42% -albuterol hfa, Trelegy 100,  Covid vax-2 Phizer Flu vax-declines -----Patient presents for a follow up for asthma.  Feels well controlled.  Has only smoked 3 cigarettes in the last 4 months-"when stressed".  Infrequent use of rescue inhaler.  Likes Trelegy-used daily.  ROS-see HPI    + = positive Constitutional:   No-   weight loss, night sweats, fevers, chills, fatigue, lassitude. HEENT:   No-  headaches, difficulty swallowing, tooth/dental problems, sore throat,       No-  sneezing, itching, ear ache,  +nasal congestion, post nasal drip,  CV:  No-   chest pain, orthopnea, PND, swelling in lower extremities, anasarca,                                                          dizziness, palpitations Resp: + shortness of breath with exertion or at rest.               productive cough,   + non-productive cough,  No- coughing up of blood.              No-   change in color of mucus.   +wheezing.   Skin:  GI:  No-   heartburn, indigestion, abdominal pain, nausea, vomiting,  GU: . MS:  No-   joint pain or swelling.  Neuro-     nothing unusual  OBJ General- Alert, Oriented, Affect-appropriate, Distress- none acute, + overweight Skin- no rash Lymphadenopathy- none Head- atraumatic            Eyes- Gross vision intact, PERRLA, conjunctivae clear secretions            Ears- Hearing, canals-normal            Nose-+ mild turbinate edema, no-Septal dev, mucus, polyps, erosion, perforation             Throat- Mallampati II , mucosa clear , drainage- none, tonsils- atrophic Neck- flexible , trachea midline, no stridor , thyroid nl, carotid no bruit Chest - symmetrical excursion , unlabored           Heart/CV- RRR , no murmur , no gallop  , no rub, nl s1 s2                           - JVD- none , edema- none, stasis changes- none, varices- none  Lung- +quiet, clear, cough- none , dullness-none, rub- none           Chest wall-  Abd-  Br/ Gen/ Rectal- Not done, not indicated Extrem- cyanosis- none, clubbing, none, atrophy- none, strength- nl Neuro- grossly intact to observation

## 2021-08-30 ENCOUNTER — Ambulatory Visit: Payer: 59 | Admitting: Internal Medicine

## 2022-08-21 NOTE — Progress Notes (Signed)
HPI male smoker followed for asthma/COPD, allergic rhinitis, tobacco use, complicated by history DVT Office spirometry 03/14/14- moderate to severe obstructive airways disease Office Spirometry 09/03/2018- Moderate obstructive airways disease.  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 ECHO 07/15/2019 EF 42% -albuterol hfa, Trelegy 100,  Covid vax-2 Phizer Flu vax-declines -----Patient presents for a follow up for asthma.  Feels well controlled.  Has only smoked 3 cigarettes in the last 4 months-"when stressed".  Infrequent use of rescue inhaler.  Likes Trelegy-used daily.  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 ECHO 07/15/2019 EF 42% -albuterol hfa, Trelegy 100,  Covid vax-2 Phizer Flu vax- ----Pt states he is doing okay Denies any respiratory exacerbation this winter. Credits Trelegy for good control. Little cough. Smokes 1-2 cigarettes/ day sometimes "to get going"- cautioned against this. Seasonal pollen allergy still Spring and Fall   ROS-see HPI    + = positive Constitutional:   No-   weight loss, night sweats, fevers, chills, fatigue, lassitude. HEENT:   No-  headaches, difficulty swallowing, tooth/dental problems, sore throat,       No-  sneezing, itching, ear ache,  +nasal congestion, post nasal drip,  CV:  No-   chest pain, orthopnea, PND, swelling in lower extremities, anasarca,                                                          dizziness, palpitations Resp: + shortness of breath with exertion or at rest.               productive cough,  + non-productive cough,  No- coughing  up of blood.              No-   change in color of mucus.   +wheezing.   Skin:  GI:  No-   heartburn, indigestion, abdominal pain, nausea, vomiting,  GU: . MS:  No-   joint pain or swelling.  Neuro-     nothing unusual  OBJ General- Alert, Oriented, Affect-appropriate, Distress- none acute, + overweight Skin- no rash Lymphadenopathy- none Head- atraumatic            Eyes- Gross vision intact, PERRLA, conjunctivae clear secretions            Ears- Hearing, canals-normal            Nose-+ mild turbinate edema, no-Septal dev, mucus, polyps, erosion, perforation             Throat- Mallampati II , mucosa clear , drainage- none, tonsils- atrophic Neck- flexible , trachea midline, no stridor , thyroid nl, carotid no bruit Chest - symmetrical excursion , unlabored           Heart/CV- RRR , no murmur , no gallop  , no rub, nl s1 s2                           - JVD- none , edema- none, stasis changes- none, varices- none  Lung- +quiet, clear, cough- none , dullness-none, rub- none           Chest wall-  Abd-  Br/ Gen/ Rectal- Not done, not indicated Extrem- cyanosis- none, clubbing, none, atrophy- none, strength- nl Neuro- grossly intact to observation

## 2022-08-22 ENCOUNTER — Ambulatory Visit: Payer: Managed Care, Other (non HMO) | Admitting: Internal Medicine

## 2022-08-22 ENCOUNTER — Encounter: Payer: Self-pay | Admitting: Internal Medicine

## 2022-08-22 VITALS — BP 130/80 | HR 91 | Ht 75.0 in | Wt 278.0 lb

## 2022-08-22 DIAGNOSIS — J3089 Other allergic rhinitis: Secondary | ICD-10-CM

## 2022-08-22 DIAGNOSIS — J4489 Other specified chronic obstructive pulmonary disease: Secondary | ICD-10-CM

## 2022-08-22 DIAGNOSIS — J302 Other seasonal allergic rhinitis: Secondary | ICD-10-CM

## 2022-08-22 DIAGNOSIS — F17200 Nicotine dependence, unspecified, uncomplicated: Secondary | ICD-10-CM | POA: Diagnosis not present

## 2022-08-22 MED ORDER — ALBUTEROL SULFATE HFA 108 (90 BASE) MCG/ACT IN AERS: 2.0000 | INHALATION_SPRAY | Freq: Four times a day (QID) | RESPIRATORY_TRACT | 4 refills | Status: DC | PRN

## 2022-08-22 MED ORDER — TRELEGY ELLIPTA 100-62.5-25 MCG/ACT IN AEPB: INHALATION_SPRAY | RESPIRATORY_TRACT | 4 refills | Status: DC

## 2022-08-22 NOTE — Patient Instructions (Signed)
Meds refilled  Lose those cigarettes before it gets harder !!

## 2022-08-22 NOTE — Assessment & Plan Note (Signed)
Antihistamines and Flonase as needed

## 2022-08-22 NOTE — Assessment & Plan Note (Signed)
Cautioned not to back-slide on this

## 2022-08-22 NOTE — Assessment & Plan Note (Signed)
Uncomplicated Plan- refill Trelegy. Strongly advised against any smoking

## 2022-08-30 ENCOUNTER — Telehealth: Payer: Self-pay | Admitting: Internal Medicine

## 2022-08-30 MED ORDER — PREDNISONE 10 MG PO TABS: ORAL_TABLET | ORAL | 0 refills | Status: AC

## 2022-08-30 MED ORDER — AMOXICILLIN-POT CLAVULANATE 875-125 MG PO TABS: 1.0000 | ORAL_TABLET | Freq: Two times a day (BID) | ORAL | 1 refills | Status: AC

## 2022-08-30 NOTE — Telephone Encounter (Signed)
Called and spoke with patient. He stated that he believes he has developed a sinus infection. He has had a headache on and off for the past 2 weeks. Pain is located right in between his eyes. He started out with just nasal congestion but now he feels like it is moving into his chest. He has been coughing up clear phlegm. He is still seeing some clear nasal discharge.   He wanted to know if Dr. Annamaria Boots would be willing to send in something for him. He mentioned that zpaks do not work for him.   [facility] [place]   Dr. Annamaria Boots, can you please advise? Thanks!

## 2022-08-30 NOTE — Telephone Encounter (Signed)
PT calling back. He asked for Pred in case his chest started to tighten up. Please call back to advise @ [phone]

## 2022-08-30 NOTE — Telephone Encounter (Signed)
PT calling. He is  fighting what he thinks is a sinus infection for 2 weeks. Wonders if Dr. Annamaria Boots can call in some Pred. (No fever. All in head and moving to chest.)  His # is [phone]  [facility]arm [facility] [address] [facility] [place] ([address])

## 2022-08-30 NOTE — Telephone Encounter (Signed)
I sent augmentin

## 2022-08-30 NOTE — Telephone Encounter (Signed)
Dr. Annamaria Boots, are you willing to send in prednisone for him as well?

## 2022-08-30 NOTE — Telephone Encounter (Signed)
States no Zpac's. They just don't work.

## 2022-08-30 NOTE — Telephone Encounter (Signed)
Prednisone 10 mg, # 20   4 X 2 DAYS, 3 X 2 DAYS, 2 X 2 DAYS, 1 X 2 DAYS  

## 2022-08-30 NOTE — Telephone Encounter (Signed)
Called and spoke with patient. He verbalized understanding. ? ?Nothing further needed at time of call.  ?

## 2022-08-30 NOTE — Telephone Encounter (Signed)
Called patient but he did not answer. Left message for him to call back.  

## 2022-08-30 NOTE — Addendum Note (Signed)
Addended by: Valerie Salts on: 08/30/2022 03:20 PM   Modules accepted: Orders

## 2023-04-02 ENCOUNTER — Telehealth: Payer: Self-pay | Admitting: Family Medicine

## 2023-04-02 NOTE — Telephone Encounter (Signed)
Made in error on wrong patient

## 2023-08-22 NOTE — Progress Notes (Unsigned)
HPI male smoker followed for asthma/COPD, allergic rhinitis, tobacco use, complicated by history DVT Office spirometry 03/14/14- moderate to severe obstructive airways disease Office Spirometry 09/03/2018- Moderate obstructive airways disease.  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 ECHO 07/15/2019 EF 42% -albuterol hfa, Trelegy 100,  Covid vax-2 Phizer Flu vax- ----Pt states he is doing okay Denies any respiratory exacerbation this winter. Credits Trelegy for good control. Little cough. Smokes 1-2 cigarettes/ day sometimes "to get going"- cautioned against this. 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 ECHO 07/15/2019 EF 42% -albuterol hfa, Trelegy 100,  Discussed the use of AI scribe software for clinical note transcription with the patient, who gave verbal consent to proceed.  History of Present Illness   The patient, with a history of COPD, presents for a medication refill. He reports no changes in his health status and denies any recent respiratory infections or flare-ups. He uses a Trelegy inhaler daily and an albuterol rescue inhaler as needed. He reports no issues with his breathing and denies any coughing. He has recently smoked one cigarette, the first in two months, and uses gum to manage oral fixation cravings. He has not yet selected a new primary care provider following the retirement of his previous doctor.           //consider CXR next visit//  ROS-see HPI    + = positive Constitutional:   No-   weight loss, night sweats,  fevers, chills, fatigue, lassitude. HEENT:   No-  headaches, difficulty swallowing, tooth/dental problems, sore throat,       No-  sneezing, itching, ear ache,  +nasal congestion, post nasal drip,  CV:  No-   chest pain, orthopnea, PND, swelling in lower extremities, anasarca,                                                           dizziness, palpitations Resp: + shortness of breath with exertion or at rest.               productive cough,  + non-productive cough,  No- coughing up of blood.              No-   change in color of mucus.   +wheezing.   Skin:  GI:  No-   heartburn, indigestion, abdominal pain, nausea, vomiting,  GU: . MS:  No-   joint pain or swelling.  Neuro-     nothing unusual  OBJ General- Alert, Oriented, Affect-appropriate, Distress- none acute, + overweight Skin- no rash Lymphadenopathy- none Head- atraumatic            Eyes- Gross vision intact, PERRLA, conjunctivae clear secretions            Ears- Hearing, canals-normal            Nose-+ mild turbinate edema, no-Septal dev, mucus, polyps, erosion, perforation             Throat- Mallampati II , mucosa clear ,  drainage- none, tonsils- atrophic Neck- flexible , trachea midline, no stridor , thyroid nl, carotid no bruit Chest - symmetrical excursion , unlabored           Heart/CV- RRR , no murmur , no gallop  , no rub, nl s1 s2                           - JVD- none , edema- none, stasis changes- none, varices- none           Lung- +quiet, clear, cough+minimal , dullness-none, rub- none           Chest wall-  Abd-  Br/ Gen/ Rectal- Not done, not indicated Extrem- cyanosis- none, clubbing, none, atrophy- none, strength- nl Neuro- grossly intact to observation

## 2023-08-25 ENCOUNTER — Encounter: Payer: Self-pay | Admitting: Internal Medicine

## 2023-08-25 ENCOUNTER — Ambulatory Visit (INDEPENDENT_AMBULATORY_CARE_PROVIDER_SITE_OTHER): Payer: Managed Care, Other (non HMO) | Admitting: Internal Medicine

## 2023-08-25 VITALS — BP 124/82 | HR 75 | Temp 98.3°F | Resp 18 | Ht 75.0 in | Wt 270.4 lb

## 2023-08-25 DIAGNOSIS — J449 Chronic obstructive pulmonary disease, unspecified: Secondary | ICD-10-CM | POA: Diagnosis not present

## 2023-08-25 MED ORDER — TRELEGY ELLIPTA 100-62.5-25 MCG/ACT IN AEPB: INHALATION_SPRAY | RESPIRATORY_TRACT | 4 refills | Status: DC

## 2023-08-25 MED ORDER — ALBUTEROL SULFATE HFA 108 (90 BASE) MCG/ACT IN AERS: 2.0000 | INHALATION_SPRAY | Freq: Four times a day (QID) | RESPIRATORY_TRACT | 4 refills | Status: DC | PRN

## 2023-08-25 NOTE — Patient Instructions (Signed)
Trelegy and Ventolin refilled   Glad you are doing well- please call if we can help

## 2023-12-30 ENCOUNTER — Telehealth: Payer: Self-pay | Admitting: Internal Medicine

## 2023-12-30 MED ORDER — PREDNISONE 10 MG PO TABS: ORAL_TABLET | ORAL | 0 refills | Status: AC

## 2023-12-30 NOTE — Telephone Encounter (Signed)
 Documentation only-  walked in with poison ivy rash on arm, asking prednisone . [facility]one  taper [facility]

## 2023-12-30 NOTE — Telephone Encounter (Signed)
 Called patient to inform that Dr. Linder Revere sent in rx for patient to pick up at pharmacy.  Patient verbalized understanding and has already picked up rx.  Nothing further needed at this time.

## 2024-04-29 NOTE — Progress Notes (Signed)
 This encounter was created in error - please disregard.

## 2024-08-02 NOTE — Progress Notes (Signed)
 HPI male smoker followed for asthma/COPD, allergic rhinitis, tobacco use, complicated by history DVT Office spirometry 03/14/14- moderate to severe obstructive airways disease Office Spirometry 09/03/2018- Moderate obstructive airways disease.  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 ECHO 07/15/2019 EF 42% -albuterol  hfa, Trelegy 100,  Discussed the use of AI scribe software for clinical note transcription with the patient, who gave verbal consent to proceed.  History of Present Illness   The patient, with a history of COPD, presents for a medication refill. He reports no changes in his health status and denies any recent respiratory infections or flare-ups. He uses a Trelegy inhaler daily and an albuterol  rescue inhaler as needed. He reports no issues with his breathing and denies any coughing. He has recently smoked one cigarette, the first in two months, and uses gum to manage oral fixation cravings. He has not yet selected a new primary care provider following the retirement of his previous doctor.        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 ECHO 07/15/2019 EF 42% -albuterol  hfa, Trelegy 100,  -----Pt states came a month early would like Refills printed out  Still smoking a couple of  cigarettes daily- discussed. Discussed the use of AI scribe software for clinical note transcription with the patient, who gave verbal consent to proceed.  History of Present Illness   Tyler [name] is a 53 year old male with chronic obstructive pulmonary disease (COPD) who presents for  medication refills and follow-up for pulmonary issues.  He uses Trelegy for maintenance and an albuterol  inhaler as needed for symptom relief. He has significantly reduced his smoking over the past couple of years and currently smokes 3-4 daily. He denies sputum production or productive cough, describing only mild winter congestion. He has not received a flu shot this fall and believes the only time he had the flu was the year he received the vaccine.     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 - Refilled albuterol  inhaler and Trelegy prescriptions. - Continue current COPD management plan.   Tobacco user -emphasis on quitting           ROS-see HPI    + = positive Constitutional:   No-   weight loss, night sweats, fevers, chills, fatigue, lassitude. HEENT:   No-  headaches, difficulty swallowing, tooth/dental problems, sore throat,       No-  sneezing, itching, ear ache,  +nasal congestion, post nasal drip,  CV:  No-   chest pain, orthopnea, PND, swelling in lower extremities, anasarca,                                                           dizziness, palpitations Resp: + shortness of breath with exertion or at rest.               productive cough,  + non-productive cough,  No- coughing up of blood.  No-   change in color of mucus.   +wheezing.   Skin:  GI:  No-   heartburn, indigestion, abdominal pain, nausea, vomiting,  GU: . MS:  No-   joint pain or swelling.  Neuro-     nothing unusual  OBJ General- Alert, Oriented, Affect-appropriate, Distress- none acute, + overweight Skin- no rash Lymphadenopathy- none Head- atraumatic            Eyes- Gross vision intact, PERRLA, conjunctivae clear secretions            Ears- Hearing, canals-normal            Nose-+ mild turbinate edema, no-Septal dev, mucus, polyps, erosion, perforation              Throat- Mallampati II , mucosa clear , drainage- none, tonsils- atrophic Neck- flexible , trachea midline, no stridor , thyroid nl, carotid no bruit Chest - symmetrical excursion , unlabored           Heart/CV- RRR , no murmur , no gallop  , no rub, nl s1 s2                           - JVD- none , edema- none, stasis changes- none, varices- none           Lung- +quiet, clear, cough+minimal , dullness-none, rub- none           Chest wall-  Abd-  Br/ Gen/ Rectal- Not done, not indicated Extrem- cyanosis- none, clubbing, none, atrophy- none, strength- nl Neuro- grossly intact to observation

## 2024-08-03 ENCOUNTER — Ambulatory Visit: Admitting: Internal Medicine

## 2024-08-03 ENCOUNTER — Encounter: Payer: Self-pay | Admitting: Internal Medicine

## 2024-08-03 VITALS — BP 117/79 | HR 80 | Ht 75.0 in | Wt 276.3 lb

## 2024-08-03 DIAGNOSIS — J449 Chronic obstructive pulmonary disease, unspecified: Secondary | ICD-10-CM | POA: Diagnosis not present

## 2024-08-03 DIAGNOSIS — F17210 Nicotine dependence, cigarettes, uncomplicated: Secondary | ICD-10-CM | POA: Diagnosis not present

## 2024-08-03 DIAGNOSIS — F17200 Nicotine dependence, unspecified, uncomplicated: Secondary | ICD-10-CM

## 2024-08-03 MED ORDER — TRELEGY ELLIPTA 100-62.5-25 MCG/ACT IN AEPB: INHALATION_SPRAY | RESPIRATORY_TRACT | 4 refills | Status: AC

## 2024-08-03 MED ORDER — ALBUTEROL SULFATE HFA 108 (90 BASE) MCG/ACT IN AERS: 2.0000 | INHALATION_SPRAY | Freq: Four times a day (QID) | RESPIRATORY_TRACT | 4 refills | Status: AC | PRN

## 2024-08-03 NOTE — Patient Instructions (Signed)
 Refill scripts printed for albuterol  and Trelegy  You CAN stop smoking, and I think you should.   At checkout, ask the front desk to bring you back next year with Dr Thom Chill

## 2024-08-10 ENCOUNTER — Encounter: Payer: Self-pay | Admitting: Internal Medicine

## 2024-08-24 ENCOUNTER — Ambulatory Visit: Payer: Managed Care, Other (non HMO) | Admitting: Internal Medicine
